# Patient Record
Sex: Male | Born: 1975 | Race: Black or African American | Hispanic: No | Marital: Single | Smoking: Never smoker
Health system: Southern US, Community
[De-identification: ages and names within clinical notes are randomized; demographics above are authoritative.]

## PROBLEM LIST (undated history)

## (undated) DIAGNOSIS — F329 Major depressive disorder, single episode, unspecified: Secondary | ICD-10-CM

## (undated) DIAGNOSIS — F32A Depression, unspecified: Secondary | ICD-10-CM

---

## 1999-10-27 ENCOUNTER — Emergency Department (HOSPITAL_COMMUNITY): Admission: EM | Admit: 1999-10-27 | Discharge: 1999-10-27 | Payer: Self-pay | Admitting: Emergency Medicine

## 2001-12-19 ENCOUNTER — Emergency Department (HOSPITAL_COMMUNITY): Admission: EM | Admit: 2001-12-19 | Discharge: 2001-12-19 | Payer: Self-pay | Admitting: Emergency Medicine

## 2004-07-07 ENCOUNTER — Emergency Department (HOSPITAL_COMMUNITY): Admission: EM | Admit: 2004-07-07 | Discharge: 2004-07-07 | Payer: Self-pay | Admitting: Emergency Medicine

## 2004-07-10 ENCOUNTER — Emergency Department (HOSPITAL_COMMUNITY): Admission: EM | Admit: 2004-07-10 | Discharge: 2004-07-10 | Payer: Self-pay | Admitting: Emergency Medicine

## 2004-09-05 ENCOUNTER — Emergency Department (HOSPITAL_COMMUNITY): Admission: EM | Admit: 2004-09-05 | Discharge: 2004-09-05 | Payer: Self-pay | Admitting: Emergency Medicine

## 2004-11-26 ENCOUNTER — Emergency Department (HOSPITAL_COMMUNITY): Admission: EM | Admit: 2004-11-26 | Discharge: 2004-11-26 | Payer: Self-pay | Admitting: Family Medicine

## 2005-02-08 ENCOUNTER — Emergency Department (HOSPITAL_COMMUNITY): Admission: EM | Admit: 2005-02-08 | Discharge: 2005-02-08 | Payer: Self-pay | Admitting: Emergency Medicine

## 2005-09-03 ENCOUNTER — Ambulatory Visit: Payer: Self-pay | Admitting: Nurse Practitioner

## 2005-09-04 ENCOUNTER — Ambulatory Visit: Payer: Self-pay | Admitting: Nurse Practitioner

## 2005-11-04 ENCOUNTER — Ambulatory Visit: Payer: Self-pay | Admitting: Nurse Practitioner

## 2005-12-08 ENCOUNTER — Ambulatory Visit: Payer: Self-pay | Admitting: Nurse Practitioner

## 2006-02-17 ENCOUNTER — Ambulatory Visit: Payer: Self-pay | Admitting: Nurse Practitioner

## 2006-02-19 ENCOUNTER — Ambulatory Visit: Payer: Self-pay | Admitting: *Deleted

## 2006-03-23 ENCOUNTER — Ambulatory Visit: Payer: Self-pay | Admitting: Nurse Practitioner

## 2006-04-21 ENCOUNTER — Ambulatory Visit: Payer: Self-pay | Admitting: Nurse Practitioner

## 2006-05-26 ENCOUNTER — Ambulatory Visit: Payer: Self-pay | Admitting: Nurse Practitioner

## 2006-08-05 ENCOUNTER — Ambulatory Visit: Payer: Self-pay | Admitting: Internal Medicine

## 2006-09-14 ENCOUNTER — Ambulatory Visit: Payer: Self-pay | Admitting: Family Medicine

## 2006-10-06 ENCOUNTER — Ambulatory Visit: Payer: Self-pay | Admitting: Nurse Practitioner

## 2006-10-07 DIAGNOSIS — M545 Low back pain: Secondary | ICD-10-CM

## 2006-10-07 DIAGNOSIS — F988 Other specified behavioral and emotional disorders with onset usually occurring in childhood and adolescence: Secondary | ICD-10-CM | POA: Insufficient documentation

## 2006-10-20 ENCOUNTER — Encounter (INDEPENDENT_AMBULATORY_CARE_PROVIDER_SITE_OTHER): Payer: Self-pay | Admitting: *Deleted

## 2006-10-24 ENCOUNTER — Emergency Department (HOSPITAL_COMMUNITY): Admission: EM | Admit: 2006-10-24 | Discharge: 2006-10-24 | Payer: Self-pay | Admitting: Emergency Medicine

## 2006-11-01 ENCOUNTER — Ambulatory Visit: Payer: Self-pay | Admitting: Internal Medicine

## 2006-11-02 ENCOUNTER — Emergency Department (HOSPITAL_COMMUNITY): Admission: EM | Admit: 2006-11-02 | Discharge: 2006-11-02 | Payer: Self-pay | Admitting: Emergency Medicine

## 2006-11-08 ENCOUNTER — Ambulatory Visit: Payer: Self-pay | Admitting: Internal Medicine

## 2006-12-20 ENCOUNTER — Ambulatory Visit: Payer: Self-pay | Admitting: Family Medicine

## 2007-02-10 ENCOUNTER — Emergency Department (HOSPITAL_COMMUNITY): Admission: EM | Admit: 2007-02-10 | Discharge: 2007-02-10 | Payer: Self-pay | Admitting: Family Medicine

## 2007-02-16 ENCOUNTER — Ambulatory Visit: Payer: Self-pay | Admitting: Internal Medicine

## 2007-04-05 ENCOUNTER — Ambulatory Visit: Payer: Self-pay | Admitting: Family Medicine

## 2007-07-13 ENCOUNTER — Ambulatory Visit: Payer: Self-pay | Admitting: Family Medicine

## 2007-09-05 ENCOUNTER — Emergency Department (HOSPITAL_COMMUNITY): Admission: EM | Admit: 2007-09-05 | Discharge: 2007-09-05 | Payer: Self-pay | Admitting: Emergency Medicine

## 2007-09-21 ENCOUNTER — Ambulatory Visit: Payer: Self-pay | Admitting: Internal Medicine

## 2007-09-21 LAB — CONVERTED CEMR LAB
Chloride: 104 meq/L (ref 96–112)
Cholesterol: 142 mg/dL (ref 0–200)
Creatinine, Ser: 1.27 mg/dL (ref 0.40–1.50)
HDL: 60 mg/dL (ref >39)
LDL Cholesterol: 58 mg/dL (ref 0–99)
Total CHOL/HDL Ratio: 2.4
Triglycerides: 121 mg/dL (ref <150)
VLDL: 24 mg/dL (ref 0–40)

## 2007-12-27 ENCOUNTER — Ambulatory Visit: Payer: Self-pay | Admitting: Internal Medicine

## 2008-05-31 ENCOUNTER — Ambulatory Visit: Payer: Self-pay | Admitting: Internal Medicine

## 2009-01-31 ENCOUNTER — Emergency Department (HOSPITAL_COMMUNITY): Admission: EM | Admit: 2009-01-31 | Discharge: 2009-01-31 | Payer: Self-pay | Admitting: Family Medicine

## 2009-02-02 ENCOUNTER — Emergency Department (HOSPITAL_COMMUNITY): Admission: EM | Admit: 2009-02-02 | Discharge: 2009-02-02 | Payer: Self-pay | Admitting: Emergency Medicine

## 2009-04-18 ENCOUNTER — Ambulatory Visit: Payer: Self-pay | Admitting: Internal Medicine

## 2009-04-24 ENCOUNTER — Ambulatory Visit: Payer: Self-pay | Admitting: Internal Medicine

## 2009-06-25 ENCOUNTER — Ambulatory Visit: Payer: Self-pay | Admitting: Family Medicine

## 2009-07-23 ENCOUNTER — Ambulatory Visit: Payer: Self-pay | Admitting: Internal Medicine

## 2010-02-23 ENCOUNTER — Encounter: Payer: Self-pay | Admitting: Nurse Practitioner

## 2010-11-13 LAB — URINE MICROSCOPIC-ADD ON

## 2010-11-13 LAB — URINALYSIS, ROUTINE W REFLEX MICROSCOPIC
Leukocytes, UA: NEGATIVE
Nitrite: NEGATIVE
pH: 7

## 2010-11-13 LAB — POCT URINALYSIS DIP (DEVICE)
Nitrite: NEGATIVE
Protein, ur: NEGATIVE
pH: 6.5

## 2014-05-23 ENCOUNTER — Encounter (HOSPITAL_COMMUNITY): Payer: Self-pay | Admitting: Emergency Medicine

## 2014-05-23 ENCOUNTER — Emergency Department (HOSPITAL_COMMUNITY)
Admission: EM | Admit: 2014-05-23 | Discharge: 2014-05-23 | Disposition: A | Discharge status: Home / Self Care | Payer: Self-pay | Attending: Emergency Medicine | Admitting: Emergency Medicine

## 2014-05-23 ENCOUNTER — Emergency Department (HOSPITAL_COMMUNITY): Payer: Self-pay

## 2014-05-23 DIAGNOSIS — S63502A Unspecified sprain of left wrist, initial encounter: Secondary | ICD-10-CM | POA: Insufficient documentation

## 2014-05-23 DIAGNOSIS — Z791 Long term (current) use of non-steroidal anti-inflammatories (NSAID): Secondary | ICD-10-CM | POA: Insufficient documentation

## 2014-05-23 DIAGNOSIS — Y9389 Activity, other specified: Secondary | ICD-10-CM | POA: Insufficient documentation

## 2014-05-23 DIAGNOSIS — Y990 Civilian activity done for income or pay: Secondary | ICD-10-CM | POA: Insufficient documentation

## 2014-05-23 DIAGNOSIS — Y9289 Other specified places as the place of occurrence of the external cause: Secondary | ICD-10-CM | POA: Insufficient documentation

## 2014-05-23 DIAGNOSIS — Z79899 Other long term (current) drug therapy: Secondary | ICD-10-CM | POA: Insufficient documentation

## 2014-05-23 DIAGNOSIS — F329 Major depressive disorder, single episode, unspecified: Secondary | ICD-10-CM | POA: Insufficient documentation

## 2014-05-23 DIAGNOSIS — X58XXXA Exposure to other specified factors, initial encounter: Secondary | ICD-10-CM | POA: Insufficient documentation

## 2014-05-23 HISTORY — DX: Major depressive disorder, single episode, unspecified: F32.9

## 2014-05-23 HISTORY — DX: Depression, unspecified: F32.A

## 2014-05-23 MED ORDER — CYCLOBENZAPRINE HCL 5 MG PO TABS: 5.0000 mg | ORAL_TABLET | Freq: Two times a day (BID) | ORAL | Status: DC | PRN

## 2014-05-23 MED ORDER — NAPROXEN 375 MG PO TABS: 375.0000 mg | ORAL_TABLET | Freq: Two times a day (BID) | ORAL | Status: DC

## 2014-05-23 MED ORDER — TRAMADOL HCL 50 MG PO TABS
50.0000 mg | ORAL_TABLET | Freq: Once | ORAL | Status: AC
  Administered 2014-05-23: 50 mg via ORAL
  Filled 2014-05-23: qty 1

## 2014-05-23 MED ORDER — HYDROCODONE-ACETAMINOPHEN 5-325 MG PO TABS: 1.0000 | ORAL_TABLET | Freq: Four times a day (QID) | ORAL | Status: DC | PRN

## 2014-05-23 NOTE — ED Notes (Signed)
PT ambulated with baseline gait; VSS; A&Ox3; no signs of distress; respirations even and unlabored; skin warm and dry; no questions upon discharge.  

## 2014-05-23 NOTE — ED Notes (Signed)
Patient to xray.

## 2014-05-23 NOTE — ED Notes (Signed)
Patient with pain in left arm since last Friday.  States he was using a shovel and the pain increased during the week.  Patient states that he has swelling in left forearm and hand.

## 2014-05-23 NOTE — ED Provider Notes (Signed)
CSN: [id number]     Arrival date & time 05/23/14  0530 History   First MD Initiated Contact with Patient 05/23/14 [phone]     Chief Complaint  Patient presents with  . Arm Pain     (Consider location/radiation/quality/duration/timing/severity/associated sxs/prior Treatment) HPI   [name] is a 38-y.o. Male, PMH of depression,  presenting with left arm pain d/t work injury using a shovel 5 days ago. A hard impact of shovel to dirt caused a sharp pain in left wrist and tingling in his fingers. Arm is swollen above wrist and hurts to move wrist in any direction; needs to hold wrist palm down.  Pain has worsened, ibuprofen not lessening pain. Cannot sleep or work due to pain. Denies any previous any previous hx of injury to the wrist.  Denies: denies change of color, denies weakness or numbness to the arm, no open wounds    Past Medical History  Diagnosis Date  . Depression    History reviewed. No pertinent past surgical history. No family history on file. History  Substance Use Topics  . Smoking status: Never Smoker   . Smokeless tobacco: Not on file  . Alcohol Use: Yes    Review of Systems  10 Systems reviewed and are negative for acute change except as noted in the HPI.    Allergies  Review of patient's allergies indicates no known allergies.  Home Medications   Prior to Admission medications   Medication Sig Start Date End Date Taking? Authorizing Provider  arginine 500 MG tablet Take 500 mg by mouth daily.   Yes Historical Provider, MD  sertraline (ZOLOFT) 100 MG tablet Take 100 mg by mouth daily.   Yes Historical Provider, MD  cyclobenzaprine (FLEXERIL) 5 MG tablet Take 1 tablet (5 mg total) by mouth 2 (two) times daily as needed for muscle spasms. 05/23/14   Marlon Peliffany Philbert Ocallaghan, PA-C  HYDROcodone-acetaminophen (NORCO/VICODIN) 5-325 MG per tablet Take 1 tablet by mouth every 6 (six) hours as needed. 05/23/14   Amrit Erck Neva SeatGreene, PA-C  naproxen (NAPROSYN) 375 MG tablet Take 1  tablet (375 mg total) by mouth 2 (two) times daily. 05/23/14   Jonah Gingras Neva SeatGreene, PA-C   BP 136/89 mmHg  Pulse 65  Temp(Src) 98.4 F (36.9 C) (Oral)  Resp 20  SpO2 100% Physical Exam  Constitutional: He appears well-developed and well-nourished. No distress.  HENT:  Head: Normocephalic and atraumatic.  Eyes: Pupils are equal, round, and reactive to light.  Neck: Normal range of motion. Neck supple.  Cardiovascular: Normal rate.   Pulmonary/Chest: Effort normal.  Abdominal: Soft.  Musculoskeletal:       Left wrist: He exhibits decreased range of motion (due to pain), tenderness (distal radial aspect), bony tenderness and swelling (distal radial side of forearm). He exhibits no effusion, no crepitus, no deformity and no laceration.  No erythema or ecchymosis. radial pulse intact, cap refill <2secs to left wrist.  Grip strengths equal.  Neurological: He is alert.  Skin: Skin is warm and dry.  Nursing note and vitals reviewed.   ED Course  Procedures (including critical care time) Labs Review Labs Reviewed - No data to display  Imaging Review Dg Wrist Complete Left  05/23/2014   CLINICAL DATA:  Left wrist pain after injury while shoveling yesterday.  EXAM: LEFT WRIST - COMPLETE 3+ VIEW  COMPARISON:  None.  FINDINGS: There is no evidence of fracture or dislocation. There is no evidence of arthropathy or other focal bone abnormality. Soft tissues are unremarkable.  IMPRESSION:  Normal exam.   Electronically Signed   By: Francene Boyers M.D.   On: 05/23/2014 07:01     EKG Interpretation None      MDM   Final diagnoses:  Wrist sprain, left, initial encounter  No fractures seen in x-ray, probable sprain. No signs of neurovascular compromise and weakness/numbness to suggest any nerve related pathology. Wrist splint for comfort and immobilization.   Vicodin (6 tabs) for night time pain, naproxen, flexeril for daytime. Recommended RICE. Orthopedic referral due to swelling and need for  further evaluation.  39 y.o.Ronald Ferrell's evaluation in the Emergency Department is complete. It has been determined that no acute conditions requiring further emergency intervention are present at this time. The patient/guardian have been advised of the diagnosis and plan. We have discussed signs and symptoms that warrant return to the ED, such as changes or worsening in symptoms.  Vital signs are stable at discharge. Filed Vitals:   05/23/14 0738  BP: 134/92  Pulse: 110  Temp:   Resp:     Patient/guardian has voiced understanding and agreed to follow-up with the PCP or specialist.          Marlon Pel, PA-C 05/23/14 1610  Vanetta Mulders, MD 05/25/14 [phone]

## 2014-05-23 NOTE — Discharge Instructions (Signed)
Sprain °A sprain is a tear in one of the strong, fibrous tissues that connect your bones (ligaments). The severity of the sprain depends on how much of the ligament is torn. The tear can be either partial or complete. °CAUSES  °Often, sprains are a result of a fall or an injury. The force of the impact causes the fibers of your ligament to stretch beyond their normal length. This excess tension causes the fibers of your ligament to tear. °SYMPTOMS  °You may have some loss of motion or increased pain within your normal range of motion. Other symptoms include: °· Bruising. °· Tenderness. °· Swelling. °DIAGNOSIS  °In order to diagnose a sprain, your caregiver will physically examine you to determine how torn the ligament is. Your caregiver may also suggest an X-ray exam to make sure no bones are broken. °TREATMENT  °If your ligament is only partially torn, treatment usually involves keeping the injured area in a fixed position (immobilization) for a short period. To do this, your caregiver will apply a bandage, cast, or splint to keep the area from moving until it heals. For a partially torn ligament, the healing process usually takes 2 to 3 weeks. °If your ligament is completely torn, you may need surgery to reconnect the ligament to the bone or to reconstruct the ligament. After surgery, a cast or splint may be applied and will need to stay on for 4 to 6 weeks while your ligament heals. °HOME CARE INSTRUCTIONS °· Keep the injured area elevated to decrease swelling. °· To ease pain and swelling, apply ice to your joint twice a day, for 2 to 3 days. °¨ Put ice in a plastic bag. °¨ Place a towel between your skin and the bag. °¨ Leave the ice on for 15 minutes. °· Only take over-the-counter or prescription medicine for pain as directed by your caregiver. °· Do not leave the injured area unprotected until pain and stiffness go away (usually 3 to 4 weeks). °· Do not allow your cast or splint to get wet. Cover your cast or  splint with a plastic bag when you shower or bathe. Do not swim. °· Your caregiver may suggest exercises for you to do during your recovery to prevent or limit permanent stiffness. °SEEK IMMEDIATE MEDICAL CARE IF: °· Your cast or splint becomes damaged. °· Your pain becomes worse. °MAKE SURE YOU: °· Understand these instructions. °· Will watch your condition. °· Will get help right away if you are not doing well or get worse. °Document Released: 01/17/2000 Document Revised: 04/13/2011 Document Reviewed: 01/31/2011 °[employer]® Patient Information ©2015 [employer]. This information is not intended to replace advice given to you by your health care provider. Make sure you discuss any questions you have with your health care provider. ° °

## 2015-08-02 ENCOUNTER — Ambulatory Visit (HOSPITAL_COMMUNITY)
Admission: EM | Admit: 2015-08-02 | Discharge: 2015-08-02 | Disposition: A | Discharge status: Home / Self Care | Payer: Self-pay | Attending: Emergency Medicine | Admitting: Emergency Medicine

## 2015-08-02 ENCOUNTER — Encounter (HOSPITAL_COMMUNITY): Payer: Self-pay | Admitting: Emergency Medicine

## 2015-08-02 DIAGNOSIS — M7712 Lateral epicondylitis, left elbow: Secondary | ICD-10-CM

## 2015-08-02 DIAGNOSIS — M7582 Other shoulder lesions, left shoulder: Secondary | ICD-10-CM

## 2015-08-02 DIAGNOSIS — M778 Other enthesopathies, not elsewhere classified: Secondary | ICD-10-CM

## 2015-08-02 MED ORDER — MELOXICAM 15 MG PO TABS
15.0000 mg | ORAL_TABLET | Freq: Every day | ORAL | Status: DC
Start: 2015-08-02 — End: 2020-05-01

## 2015-08-02 MED ORDER — HYDROCODONE-ACETAMINOPHEN 5-325 MG PO TABS: 1.0000 | ORAL_TABLET | Freq: Four times a day (QID) | ORAL | Status: AC | PRN

## 2015-08-02 NOTE — Discharge Instructions (Signed)
You have tendinitis in your shoulder, elbow, and wrist. This is coming from all the heavy lifting and weight lifting you do. No weight lifting for the next week. I provided a work note to limit lifting with the left arm for the next week. Take meloxicam daily for the next week, after that as needed for pain. Ice your shoulder and elbow 2-3 times a day for the next week. Do gentle range of motion exercises. Use the hydrocodone at bedtime for severe pain. Do not drive while taking this medicine. If this is not improving in the next 5 days, please follow-up with sports medicine for additional evaluation.

## 2015-08-02 NOTE — ED Notes (Signed)
Here for left shoulder pain... Denies inj/trauma Works lifting heavy objects... Pain increases w/activity A&O x4... NAD

## 2015-08-02 NOTE — ED Provider Notes (Signed)
CSN: [id number]     Arrival date & time 08/02/15  1311 History   First MD Initiated Contact with Patient 08/02/15 1338     Chief Complaint  Patient presents with  . Shoulder Pain   (Consider location/radiation/quality/duration/timing/severity/associated sxs/prior Treatment) HPI A 40 year old man here for evaluation of left shoulder pain. He states this is been going on for about a month now. It has gradually been worsening. He has had difficulty sleeping the last few nights from the pain. He denies any specific injury or trauma, but states he does a lot of lifting both at work and at the gym. He has not tried any medications or ice. He was just hoping it would go away.  Pain is in the posterior shoulder and will radiate down to his elbow, depending on how he moves it. He also reports some pain in the lateral elbow and the wrist as well. No numbness, tingling, or weakness.  Past Medical History  Diagnosis Date  . Depression    History reviewed. No pertinent past surgical history. History reviewed. No pertinent family history. Social History  Substance Use Topics  . Smoking status: Never Smoker   . Smokeless tobacco: None  . Alcohol Use: Yes    Review of Systems As in history of present illness Allergies  Review of patient's allergies indicates no known allergies.  Home Medications   Prior to Admission medications   Medication Sig Start Date End Date Taking? Authorizing Provider  arginine 500 MG tablet Take 500 mg by mouth daily.    Historical Provider, MD  HYDROcodone-acetaminophen (NORCO) 5-325 MG tablet Take 1 tablet by mouth every 6 (six) hours as needed for moderate pain. 08/02/15   Charm RingsErin J Honig, MD  meloxicam (MOBIC) 15 MG tablet Take 1 tablet (15 mg total) by mouth daily. 08/02/15   Charm RingsErin J Honig, MD  sertraline (ZOLOFT) 100 MG tablet Take 100 mg by mouth daily.    Historical Provider, MD   Meds Ordered and Administered this Visit  Medications - No data to display  BP  125/74 mmHg  Pulse 83  Temp(Src) 99.1 F (37.3 C) (Oral)  Resp 12  SpO2 100% No data found.   Physical Exam  Constitutional: He is oriented to person, place, and time. He appears well-developed and well-nourished. No distress.  Cardiovascular: Normal rate.   Pulmonary/Chest: Effort normal.  Musculoskeletal:  Left shoulder: No erythema or edema. No asymmetry when compared to the right. No bony tenderness or point tenderness. Positive empty can and Hawkins. Left elbow: No erythema or swelling. He is point tender over the lateral epicondyles. Left wrist: No erythema or swelling. He does have pain with resisted supination. 2+ radial pulse.  Neurological: He is alert and oriented to person, place, and time.    ED Course  Procedures (including critical care time)  Labs Review Labs Reviewed - No data to display  Imaging Review No results found.   MDM   1. Rotator cuff tendinitis, left   2. Tennis elbow syndrome, left   3. Left wrist tendonitis    He has diffuse tendinitis in the left upper extremity. No sign of neurologic involvement. Treat with relative rest, meloxicam, and ice. Work note provided. Prescription for 10 hydrocodone given to use at bedtime as needed. Follow-up with sports medicine if not improving.    Charm RingsErin J Honig, MD 08/02/15 [phone]

## 2015-11-30 ENCOUNTER — Emergency Department (HOSPITAL_COMMUNITY)
Admission: EM | Admit: 2015-11-30 | Discharge: 2015-11-30 | Disposition: A | Discharge status: Home / Self Care | Payer: Self-pay | Attending: Emergency Medicine | Admitting: Emergency Medicine

## 2015-11-30 ENCOUNTER — Encounter (HOSPITAL_COMMUNITY): Payer: Self-pay | Admitting: Emergency Medicine

## 2015-11-30 DIAGNOSIS — Y929 Unspecified place or not applicable: Secondary | ICD-10-CM | POA: Insufficient documentation

## 2015-11-30 DIAGNOSIS — S76312A Strain of muscle, fascia and tendon of the posterior muscle group at thigh level, left thigh, initial encounter: Secondary | ICD-10-CM

## 2015-11-30 DIAGNOSIS — X501XXA Overexertion from prolonged static or awkward postures, initial encounter: Secondary | ICD-10-CM | POA: Insufficient documentation

## 2015-11-30 DIAGNOSIS — Y990 Civilian activity done for income or pay: Secondary | ICD-10-CM | POA: Insufficient documentation

## 2015-11-30 DIAGNOSIS — Y9389 Activity, other specified: Secondary | ICD-10-CM | POA: Insufficient documentation

## 2015-11-30 MED ORDER — NAPROXEN 500 MG PO TABS: 500.0000 mg | ORAL_TABLET | Freq: Two times a day (BID) | ORAL | 0 refills | Status: DC

## 2015-11-30 NOTE — ED Notes (Signed)
6 in ACE wrap applied to left thigh.

## 2015-11-30 NOTE — ED Triage Notes (Signed)
Pt. Stated, I think I pulled a muscle at work on my left thigh.  Its been sore for 2 weeks, and now Its black and blue.I was lifting something and evidentially it pulled the muscle in my leg.

## 2015-11-30 NOTE — Discharge Instructions (Signed)
You likely have a partial tear in your hamstring muscle. Avoid strenuous exercise. Keep bandage on. Follow up with orthopedics for re-evaluation.

## 2015-11-30 NOTE — ED Provider Notes (Signed)
[facility]-EMERGENCY DEPT Provider Note   By signing my name below, I, Ronald Ferrell, attest that this documentation has been prepared under the direction and in the presence of AvayaSamantha Dowless, PA-C. Electronically Signed: Earmon PhoenixJennifer Ferrell, ED Scribe. 11/30/15. 1:32 PM.    History   Chief Complaint Chief Complaint  Patient presents with  . Leg Pain    The history is provided by the patient and medical records. No language interpreter was used.    HPI Comments:  Ronald Ferrell is a 40 y.o. male who presents to the Emergency Department complaining of left posterior thigh pain that began about two weeks ago while at work. He states he squatted down to lift a box when the pain began. He states there has been a knot in the area since the incident. Pt states he noticed the area was bruised as well. He has not taken anything for pain. Walking, sitting and bending the LLE increases the pain. He denies alleviating factors. He denies numbness, tingling or weakness of the lower extremities, fever, chills, nausea, or vomiting. He denies any trauma or fall.   Past Medical History:  Diagnosis Date  . Depression     Patient Active Problem List   Diagnosis Date Noted  . ADD 10/07/2006  . LOW BACK PAIN, CHRONIC 10/07/2006    History reviewed. No pertinent surgical history.     Home Medications    Prior to Admission medications   Medication Sig Start Date End Date Taking? Authorizing Provider  arginine 500 MG tablet Take 500 mg by mouth daily.    Historical Provider, MD  HYDROcodone-acetaminophen (NORCO) 5-325 MG tablet Take 1 tablet by mouth every 6 (six) hours as needed for moderate pain. 08/02/15   Charm RingsErin J Honig, MD  meloxicam (MOBIC) 15 MG tablet Take 1 tablet (15 mg total) by mouth daily. 08/02/15   Charm RingsErin J Honig, MD  sertraline (ZOLOFT) 100 MG tablet Take 100 mg by mouth daily.    Historical Provider, MD    Family History No family history on file.  Social History Social History    Substance Use Topics  . Smoking status: Never Smoker  . Smokeless tobacco: Never Used  . Alcohol use Yes     Allergies   Review of patient's allergies indicates no known allergies.   Review of Systems Review of Systems A complete 10 system review of systems was obtained and all systems are negative except as noted in the HPI and PMH.    Physical Exam Updated Vital Signs BP 143/81 (BP Location: Left Arm)   Pulse 78   Temp 98.1 F (36.7 C) (Oral)   Resp 20   Ht 5\' 7"  (1.702 m)   Wt 215 lb (97.5 kg)   SpO2 100%   BMI 33.67 kg/m   Physical Exam  Constitutional: He is oriented to person, place, and time. He appears well-developed and well-nourished. No distress.  HENT:  Head: Normocephalic and atraumatic.  Eyes: Conjunctivae are normal. Right eye exhibits no discharge. Left eye exhibits no discharge. No scleral icterus.  Cardiovascular: Normal rate.   Pulmonary/Chest: Effort normal.  Musculoskeletal:  TTP and mild swelling over left hamstring. There is ecchymosis present distal to patient's tenderness along posterior thigh and medial aspect of left knee. No tenderness over ecchymosis. Patient still able to flex and extend his quadriceps.  Neurological: He is alert and oriented to person, place, and time. Coordination normal.  No gait abnormality  Skin: Skin is warm and dry. No rash noted. He  is not diaphoretic. No erythema. No pallor.  Psychiatric: He has a normal mood and affect. His behavior is normal.  Nursing note and vitals reviewed.    ED Treatments / Results  DIAGNOSTIC STUDIES: Oxygen Saturation is 100% on RA, normal by my interpretation.   COORDINATION OF CARE: 12:41 PM- Will speak with Dr. Jacqulyn BathLong about appropriate course of treatment. Pt verbalizes understanding and agrees to plan.  Medications - No data to display  Labs (all labs ordered are listed, but only abnormal results are displayed) Labs Reviewed - No data to display  EKG  EKG  Interpretation None       Radiology No results found.  Procedures Procedures (including critical care time)  Medications Ordered in ED Medications - No data to display   Initial Impression / Assessment and Plan / ED Course  I have reviewed the triage vital signs and the nursing notes.  Pertinent labs & imaging results that were available during my care of the patient were reviewed by me and considered in my medical decision making (see chart for details).  Clinical Course    Otherwise healthy 40 year old male presents to the ED today complaining of ongoing left hamstring pain for the last 2 weeks. Patient experienced pain after squatting down and lifting a heavy box, he heard a pop. He's noticed swelling and tenderness to the hamstring mostly with flexion and ambulation. Is also developed bruising along the backside of his knee. On exam, patient has some mild swelling and tenderness to his hamstring. Concern for partial tear of hamstring muscle. Do not suspect complete rupture as patient is still able to flex, extend and ambulate. Will wrapped tightly with Ace bandage. Recommend anti-inflammatories and follow-up with orthopedics. Return precautions outlined in patient discharge instructions.  Case discussed with Dr. Jacqulyn Bathlong who agrees with treatment plan.  I personally performed the services described in this documentation, which was scribed in my presence. The recorded information has been reviewed and is accurate.  Final Clinical Impressions(s) / ED Diagnoses   Final diagnoses:  None    New Prescriptions New Prescriptions   No medications on file     Dub MikesSamantha Tripp Dowless, PA-C 12/01/15 1100    Maia PlanJoshua G Long, MD 12/01/15 [phone]

## 2015-11-30 NOTE — ED Notes (Signed)
C/o pain and bruising to left inner thigh x 2 weeks ago after lifting a heavy box. States her heard "a rip".

## 2016-04-04 IMAGING — DX DG WRIST COMPLETE 3+V*L*
4 series · 4 of 4 positions shown · non-contrast
Comparison: None.

CLINICAL DATA: Left wrist pain after injury while shoveling
yesterday.

EXAM:
LEFT WRIST - COMPLETE 3+ VIEW

[wrist pa]
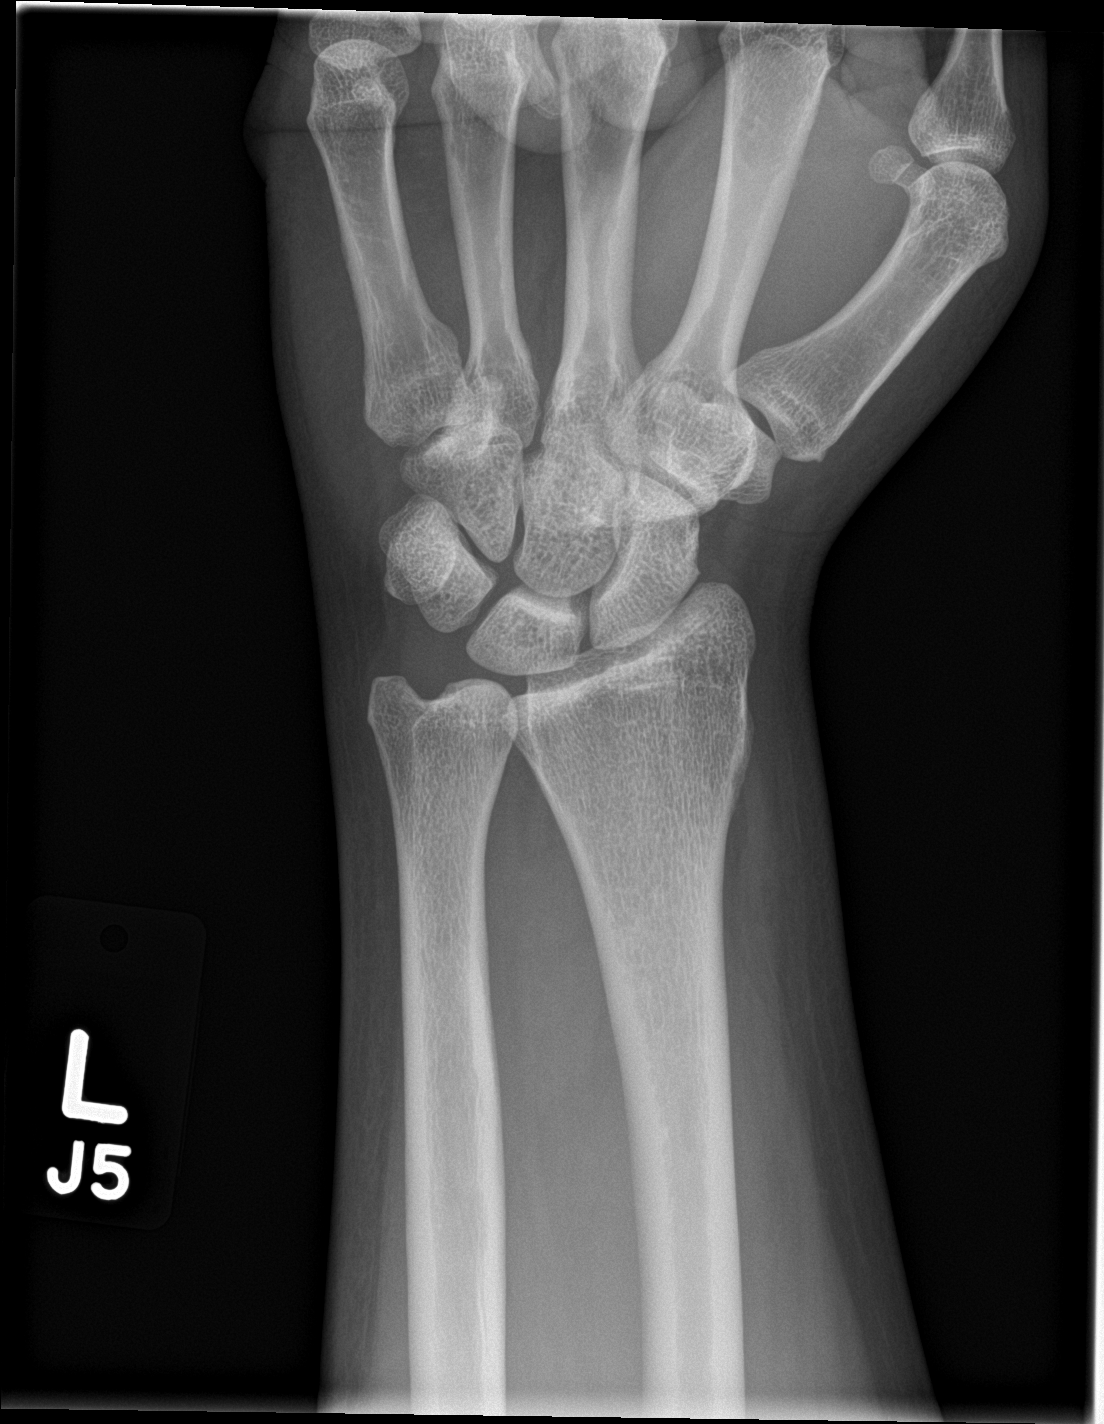

[wrist obl]
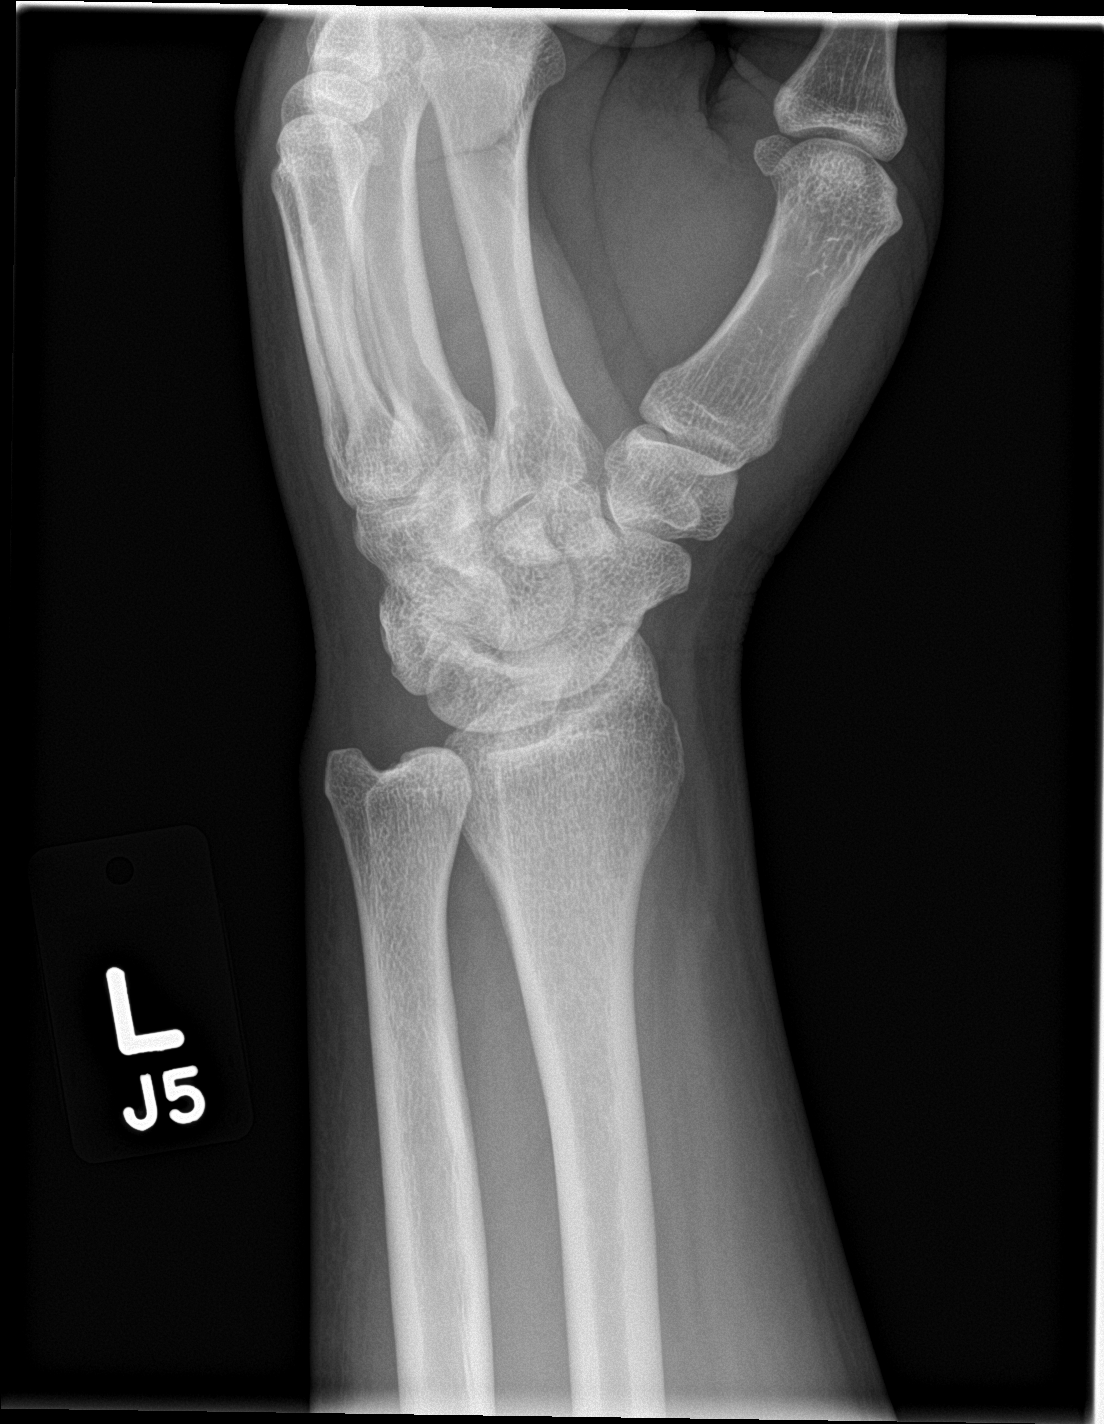

[wrist lat]
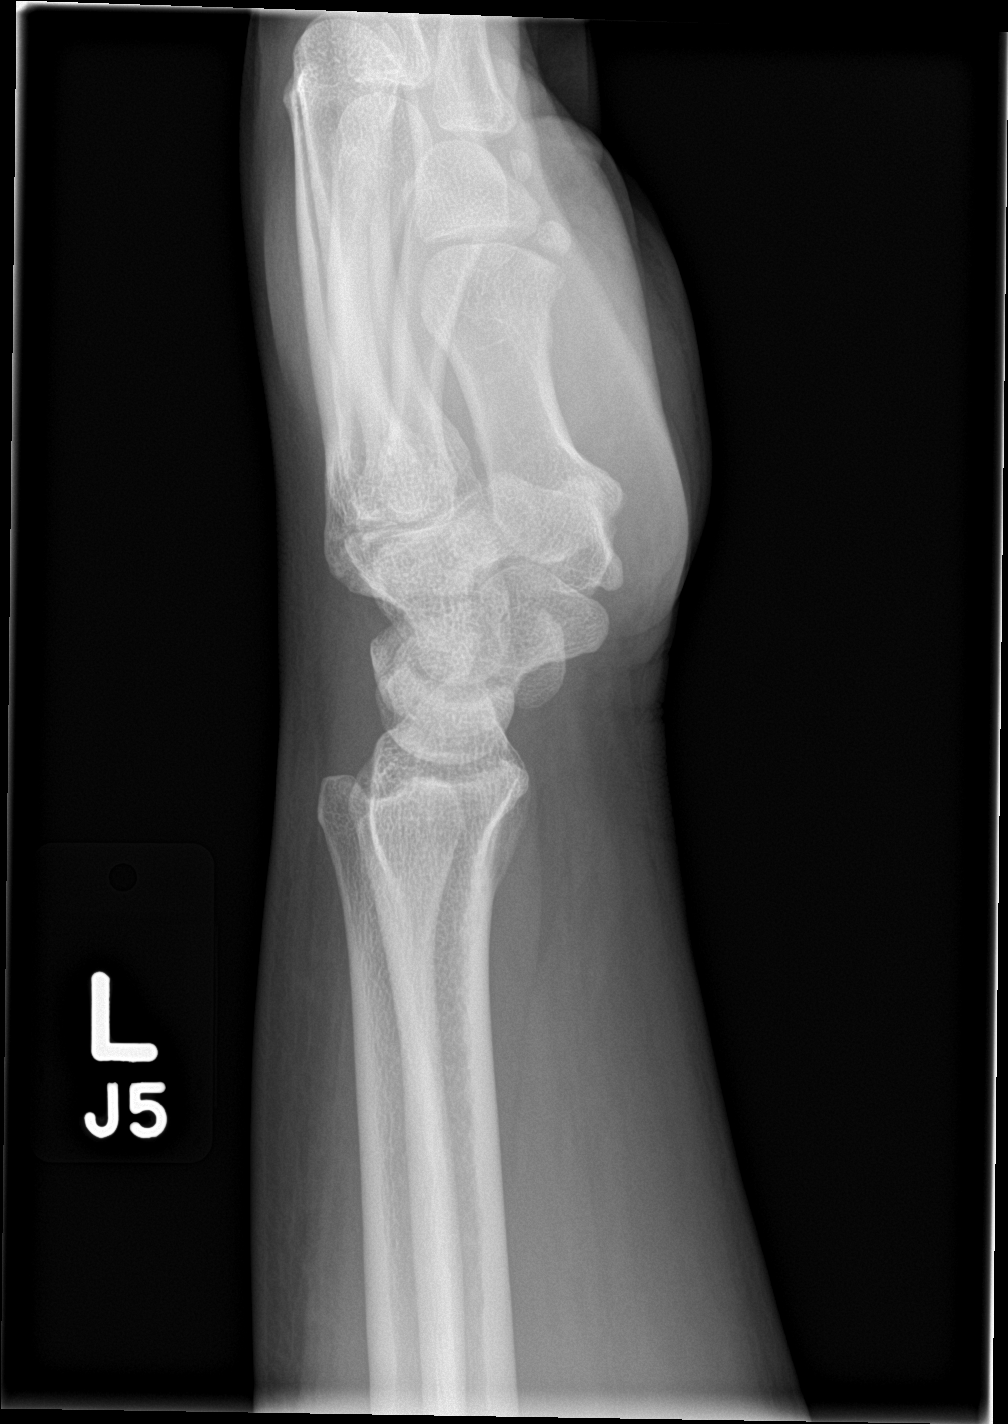

[wrist navicular]
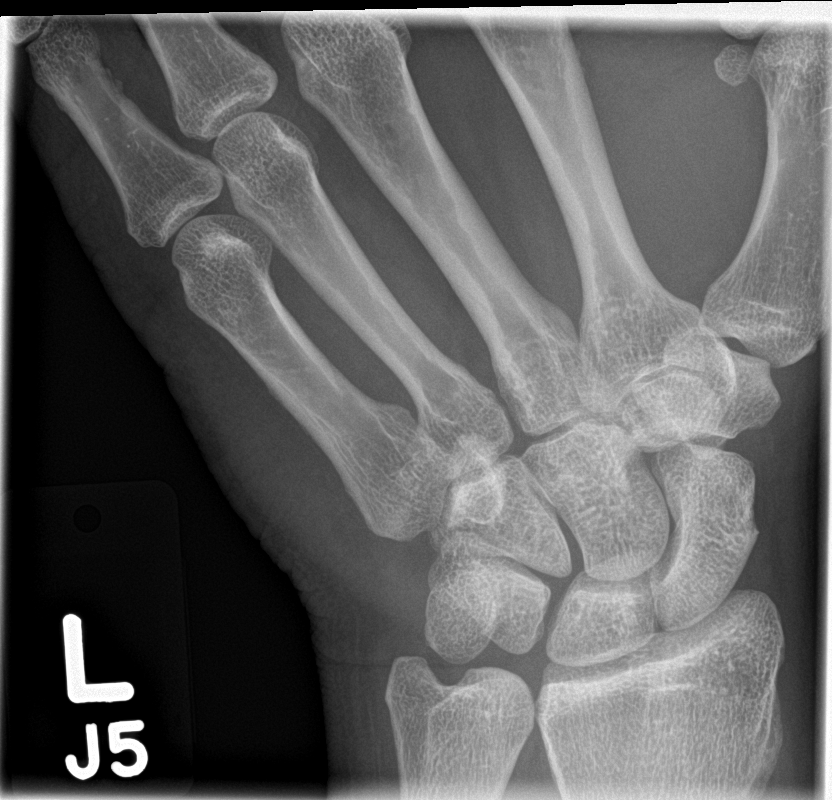

[4 of 4 positions shown; findings below may reference images not displayed]

FINDINGS: There is no evidence of fracture or dislocation. There is no
evidence of arthropathy or other focal bone abnormality. Soft
tissues are unremarkable.
IMPRESSION: Normal exam.

## 2019-09-20 ENCOUNTER — Encounter (HOSPITAL_COMMUNITY): Payer: Self-pay | Admitting: Psychiatry

## 2019-09-20 ENCOUNTER — Other Ambulatory Visit: Payer: Self-pay

## 2019-09-20 ENCOUNTER — Ambulatory Visit (INDEPENDENT_AMBULATORY_CARE_PROVIDER_SITE_OTHER): Payer: No Payment, Other | Admitting: Psychiatry

## 2019-09-20 DIAGNOSIS — F900 Attention-deficit hyperactivity disorder, predominantly inattentive type: Secondary | ICD-10-CM | POA: Diagnosis not present

## 2019-09-20 DIAGNOSIS — F3170 Bipolar disorder, currently in remission, most recent episode unspecified: Secondary | ICD-10-CM

## 2019-09-20 DIAGNOSIS — F319 Bipolar disorder, unspecified: Secondary | ICD-10-CM | POA: Insufficient documentation

## 2019-09-20 DIAGNOSIS — F325 Major depressive disorder, single episode, in full remission: Secondary | ICD-10-CM | POA: Diagnosis not present

## 2019-09-20 MED ORDER — ATOMOXETINE HCL 40 MG PO CAPS: 40.0000 mg | ORAL_CAPSULE | Freq: Every day | ORAL | 2 refills | Status: DC

## 2019-09-20 MED ORDER — SERTRALINE HCL 100 MG PO TABS: 100.0000 mg | ORAL_TABLET | Freq: Every day | ORAL | 2 refills | Status: DC

## 2019-09-20 NOTE — Progress Notes (Signed)
Psychiatric Initial Adult Assessment   Patient Identification: Ronald Ferrell MRN:  [id number] Date of Evaluation:  09/20/2019 Referral Source: [facility]/walkin Chief Complaint:   "I just need refills" Visit Diagnosis:    ICD-10-CM   1. Attention deficit hyperactivity disorder (ADHD), predominantly inattentive type  F90.0 atomoxetine (STRATTERA) 40 MG capsule  2. Depression, major, in remission (HCC)  F32.5 sertraline (ZOLOFT) 100 MG tablet  3. Bipolar disorder in full remission, most recent episode unspecified type (HCC)  F31.70     History of Present Illness: 44 year old male seen today for initial psychiatric evaluation.  He a former patient of [facility].  Today he walked in to the outpatient clinicin for psychiatric evaluation and medication management.  He has a psychiatric history of bipolar disorder and attention deficit disorder.  He is currently being managed on Strattera 40 mg daily and Zoloft 100 mg daily and note these medications are effective in managing his psychiatric conditions.  He denies symptoms of depression, mania, anxiety, SI/HI/VH or paranoia.  He notes that overall he is doing well.  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  He currently lives with his parents and notes that he is not in a relationship because he has trust issues.  He notes in the past several of his partners had abortions and now he enjoys being bachelor.  Patient notes that he finds enjoyment and traveling, going to the gym, and going to different festivals around town.  No medication adjustments made today.  Prescription sent to preferred pharmacy.  Patient will follow up with outpatient provider in 3 months for further assessment.  No other concerns noted at this time.  Associated Signs/Symptoms: Depression Symptoms:  Denies (Hypo) Manic Symptoms:  Denies Anxiety Symptoms:  Denies Psychotic Symptoms:  Denies PTSD  Symptoms: NA  Past Psychiatric History: Polar disorder and ADD Previous Psychotropic Medications: Yes   Substance Abuse History in the last 12 months:  Yes.    Consequences of Substance Abuse: NA  Past Medical History:  Past Medical History:  Diagnosis Date  . Depression    No past surgical history on file.  Family Psychiatric History: Denies  Family History: No family history on file.  Social History:   Social History   Socioeconomic History  . Marital status: Single    Spouse name: Not on file  . Number of children: Not on file  . Years of education: Not on file  . Highest education level: Not on file  Occupational History  . Not on file  Tobacco Use  . Smoking status: Never Smoker  . Smokeless tobacco: Never Used  Substance and Sexual Activity  . Alcohol use: Yes  . Drug use: Yes    Types: Marijuana  . Sexual activity: Not on file  Other Topics Concern  . Not on file  Social History Narrative  . Not on file   Social Determinants of Health   Financial Resource Strain:   . Difficulty of Paying Living Expenses:   Food Insecurity:   . Worried About Programme researcher, broadcasting/film/video in the Last Year:   . Barista in the Last Year:   Transportation Needs:   . Freight forwarder (Medical):   Marland Kitchen Lack of Transportation (Non-Medical):   Physical Activity:   . Days of Exercise per Week:   . Minutes of Exercise per Session:   Stress:   . Feeling of Stress :  Social Connections:   . Frequency of Communication with Friends and Family:   . Frequency of Social Gatherings with Friends and Family:   . Attends Religious Services:   . Active Member of Clubs or Organizations:   . Attends Banker Meetings:   Marland Kitchen Marital Status:     Additional Social History: Patient lives in [place] with his parents.  He is single.  He has no children.  He denies alcohol and tobacco use.  He endorses daily marijuana use.  Allergies:  No Known Allergies  Metabolic  Disorder Labs: No results found for: HGBA1C, MPG No results found for: PROLACTIN Lab Results  Component Value Date   CHOL 142 09/21/2007   TRIG 121 09/21/2007   HDL 60 09/21/2007   CHOLHDL 2.4 Ratio 09/21/2007   VLDL 24 09/21/2007   LDLCALC 58 09/21/2007   No results found for: TSH  Therapeutic Level Labs: No results found for: LITHIUM No results found for: CBMZ No results found for: VALPROATE  Current Medications: Current Outpatient Medications  Medication Sig Dispense Refill  . arginine 500 MG tablet Take 500 mg by mouth daily.    Marland Kitchen atomoxetine (STRATTERA) 40 MG capsule Take 1 capsule (40 mg total) by mouth daily. 30 capsule 2  . HYDROcodone-acetaminophen (NORCO) 5-325 MG tablet Take 1 tablet by mouth every 6 (six) hours as needed for moderate pain. 10 tablet 0  . meloxicam (MOBIC) 15 MG tablet Take 1 tablet (15 mg total) by mouth daily. 30 tablet 0  . naproxen (NAPROSYN) 500 MG tablet Take 1 tablet (500 mg total) by mouth 2 (two) times daily. 30 tablet 0  . sertraline (ZOLOFT) 100 MG tablet Take 1 tablet (100 mg total) by mouth daily. 30 tablet 2   No current facility-administered medications for this visit.    Musculoskeletal: Strength & Muscle Tone: within normal limits Gait & Station: normal Patient leans: N/A  Psychiatric Specialty Exam: Review of Systems  There were no vitals taken for this visit.There is no height or weight on file to calculate BMI.  General Appearance: Well Groomed  Eye Contact:  Good  Speech:  Clear and Coherent and Normal Rate  Volume:  Normal  Mood:  Euthymic  Affect:  Congruent  Thought Process:  Coherent, Goal Directed and Linear  Orientation:  Full (Time, Place, and Person)  Thought Content:  WDL and Logical  Suicidal Thoughts:  No  Homicidal Thoughts:  No  Memory:  Immediate;   Good Recent;   Good Remote;   Good  Judgement:  Good  Insight:  Good  Psychomotor Activity:  Normal  Concentration:  Concentration: Good and Attention  Span: Good  Recall:  Good  Fund of Knowledge:Good  Language: Good  Akathisia:  No  Handed:  Right  AIMS (if indicated):  Not done  Assets:  Communication Skills Desire for Improvement Financial Resources/Insurance Housing Leisure Time Social Support  ADL's:  Intact  Cognition: WNL  Sleep:  Good   Screenings:   Assessment and Plan: Patient reports that he is doing well on his current medication.  No medication changes made today.  He is agreeable to continuing all medications as prescribed.  1. Attention deficit hyperactivity disorder (ADHD), predominantly inattentive type  Continue- atomoxetine (STRATTERA) 40 MG capsule; Take 1 capsule (40 mg total) by mouth daily.  Dispense: 30 capsule; Refill: 2  2. Depression, major, in remission (HCC)  Continue- sertraline (ZOLOFT) 100 MG tablet; Take 1 tablet (100 mg total) by mouth daily.  Dispense:  30 tablet; Refill: 2  3. Bipolar disorder in full remission, most recent episode unspecified type [facility])      Follow-up in 3 months  Shanna Cisco, NP 8/18/20211:36 PM

## 2019-12-13 ENCOUNTER — Other Ambulatory Visit: Payer: Self-pay

## 2019-12-13 ENCOUNTER — Ambulatory Visit (INDEPENDENT_AMBULATORY_CARE_PROVIDER_SITE_OTHER): Payer: No Payment, Other | Admitting: Psychiatry

## 2019-12-13 ENCOUNTER — Encounter (HOSPITAL_COMMUNITY): Payer: Self-pay | Admitting: Psychiatry

## 2019-12-13 DIAGNOSIS — F325 Major depressive disorder, single episode, in full remission: Secondary | ICD-10-CM

## 2019-12-13 DIAGNOSIS — F900 Attention-deficit hyperactivity disorder, predominantly inattentive type: Secondary | ICD-10-CM

## 2019-12-13 MED ORDER — ATOMOXETINE HCL 40 MG PO CAPS: 40.0000 mg | ORAL_CAPSULE | Freq: Every day | ORAL | 2 refills | Status: DC

## 2019-12-13 MED ORDER — SERTRALINE HCL 100 MG PO TABS: 100.0000 mg | ORAL_TABLET | Freq: Every day | ORAL | 2 refills | Status: DC

## 2019-12-13 NOTE — Progress Notes (Signed)
BH MD/PA/NP OP Progress Note  12/13/2019 4:53 PM Ronald Ferrell  MRN:  [id number]  Chief Complaint: "IM just here cause I have to be but im reat" Chief Complaint    Medication Management     HPI: 44 year old male  seen today for follow-up psychiatric evaluation for medication management. Patient has a psychiatric history of ADHD, depression, and bipolar 1 disorder.  He is currently managed on Strattera 40 mg daily and Zoloft 100 mg daily in the morning. Patient reports that his current medication regimen has been effective in managing his psychiatric conditions.   Today, patient reports that things are going perfect.  He is pleasant, cooperative, and well-groomed on assessment.  He denies any anxiety or depressive symptoms today.  He denies feeling on edge, having trouble relaxing, paranoia, or excessive worrying.  He also denies anhedonia, hopelessness, low energy, poor appetite, hypersomnia/insomnia, or poor concentration.  He reports that he is sleeping well and takes naps often after working out at the gym.  He denies any SI/HI/VAH.   Patient endorses that he feels very focused and he hasn't experienced feeling distracted lately.  He also mentioned that he is feeling motivated and he is planning to go back to college soon.   No additional concerns were noted at this time.  Patient is agreeable to continue his current medication regimen as prescribed.   Visit Diagnosis:    ICD-10-CM   1. Attention deficit hyperactivity disorder (ADHD), predominantly inattentive type  F90.0 atomoxetine (STRATTERA) 40 MG capsule  2. Depression, major, in remission (HCC)  F32.5 sertraline (ZOLOFT) 100 MG tablet    Past Psychiatric History: Bipolar disorder and ADD Past Medical History:  Past Medical History:  Diagnosis Date  . Depression    History reviewed. No pertinent surgical history.  Family Psychiatric History: Denies  Family History: History reviewed. No pertinent family history.  Social  History:  Social History   Socioeconomic History  . Marital status: Single    Spouse name: Not on file  . Number of children: Not on file  . Years of education: Not on file  . Highest education level: Not on file  Occupational History  . Not on file  Tobacco Use  . Smoking status: Never Smoker  . Smokeless tobacco: Never Used  Substance and Sexual Activity  . Alcohol use: Yes  . Drug use: Yes    Types: Marijuana  . Sexual activity: Not on file  Other Topics Concern  . Not on file  Social History Narrative  . Not on file   Social Determinants of Health   Financial Resource Strain:   . Difficulty of Paying Living Expenses: Not on file  Food Insecurity:   . Worried About Programme researcher, broadcasting/film/video in the Last Year: Not on file  . Ran Out of Food in the Last Year: Not on file  Transportation Needs:   . Lack of Transportation (Medical): Not on file  . Lack of Transportation (Non-Medical): Not on file  Physical Activity:   . Days of Exercise per Week: Not on file  . Minutes of Exercise per Session: Not on file  Stress:   . Feeling of Stress : Not on file  Social Connections:   . Frequency of Communication with Friends and Family: Not on file  . Frequency of Social Gatherings with Friends and Family: Not on file  . Attends Religious Services: Not on file  . Active Member of Clubs or Organizations: Not on file  . Attends Club or  Organization Meetings: Not on file  . Marital Status: Not on file    Allergies: No Known Allergies  Metabolic Disorder Labs: No results found for: HGBA1C, MPG No results found for: PROLACTIN Lab Results  Component Value Date   CHOL 142 09/21/2007   TRIG 121 09/21/2007   HDL 60 09/21/2007   CHOLHDL 2.4 Ratio 09/21/2007   VLDL 24 09/21/2007   LDLCALC 58 09/21/2007   No results found for: TSH  Therapeutic Level Labs: No results found for: LITHIUM No results found for: VALPROATE No components found for:  CBMZ  Current Medications: Current  Outpatient Medications  Medication Sig Dispense Refill  . arginine 500 MG tablet Take 500 mg by mouth daily.    Marland Kitchen atomoxetine (STRATTERA) 40 MG capsule Take 1 capsule (40 mg total) by mouth daily. 30 capsule 2  . HYDROcodone-acetaminophen (NORCO) 5-325 MG tablet Take 1 tablet by mouth every 6 (six) hours as needed for moderate pain. 10 tablet 0  . meloxicam (MOBIC) 15 MG tablet Take 1 tablet (15 mg total) by mouth daily. 30 tablet 0  . naproxen (NAPROSYN) 500 MG tablet Take 1 tablet (500 mg total) by mouth 2 (two) times daily. 30 tablet 0  . sertraline (ZOLOFT) 100 MG tablet Take 1 tablet (100 mg total) by mouth daily. 30 tablet 2   No current facility-administered medications for this visit.     Musculoskeletal: Strength & Muscle Tone: within normal limits Gait & Station: normal Patient leans: N/A  Psychiatric Specialty Exam: Review of Systems  Blood pressure (!) 160/99, pulse 66, temperature 98.1 F (36.7 C), temperature source Oral, height 5\' 8"  (1.727 m), weight 210 lb (95.3 kg), SpO2 100 %.Body mass index is 31.93 kg/m.  General Appearance: Well Groomed  Eye Contact:  Good  Speech:  Clear and Coherent and Normal Rate  Volume:  Normal  Mood:  Euthymic  Affect:  Congruent  Thought Process:  Coherent, Goal Directed and Linear  Orientation:  Full (Time, Place, and Person)  Thought Content: WDL and Logical   Suicidal Thoughts:  No  Homicidal Thoughts:  No  Memory:  Immediate;   Good Recent;   Good Remote;   Good  Judgement:  Good  Insight:  Good  Psychomotor Activity:  Normal  Concentration:  Concentration: Good and Attention Span: Good  Recall:  Good  Fund of Knowledge: Good  Language: Good  Akathisia:  No  Handed:  Right  AIMS (if indicated): Not done  Assets:  Communication Skills Desire for Improvement Financial Resources/Insurance Housing Social Support  ADL's:  Intact  Cognition: WNL  Sleep:  Good   Screenings: GAD-7     Clinical Support [facility] 12/13/2019  in [facility]  Total GAD-7 Score 0    PHQ2-9     Clinical Support [facility] 12/13/2019 in [facility]  PHQ-2 Total Score 0  PHQ-9 Total Score 1       Assessment and Plan: Patient reports that he is doing well on his current medication.  No medication changes made today.  He is agreeable to continuing all medications as prescribed.  1. Attention deficit hyperactivity disorder (ADHD), predominantly inattentive type  Continue- atomoxetine (STRATTERA) 40 MG capsule; Take 1 capsule (40 mg total) by mouth daily.  Dispense: 30 capsule; Refill: 2  2. Depression, major, in remission (HCC)  Continue- sertraline (ZOLOFT) 100 MG tablet; Take 1 tablet (100 mg total) by mouth daily.  Dispense: 30 tablet; Refill: 2   Isaih Bulger E  Doyne Keel, NP 12/13/2019, 4:53 PM

## 2020-03-14 ENCOUNTER — Encounter (HOSPITAL_COMMUNITY): Payer: No Payment, Other | Admitting: Psychiatry

## 2020-05-01 ENCOUNTER — Ambulatory Visit (INDEPENDENT_AMBULATORY_CARE_PROVIDER_SITE_OTHER): Payer: No Payment, Other | Admitting: Psychiatry

## 2020-05-01 ENCOUNTER — Other Ambulatory Visit: Payer: Self-pay

## 2020-05-01 ENCOUNTER — Encounter (HOSPITAL_COMMUNITY): Payer: Self-pay | Admitting: Psychiatry

## 2020-05-01 DIAGNOSIS — F325 Major depressive disorder, single episode, in full remission: Secondary | ICD-10-CM

## 2020-05-01 DIAGNOSIS — F900 Attention-deficit hyperactivity disorder, predominantly inattentive type: Secondary | ICD-10-CM

## 2020-05-01 MED ORDER — SERTRALINE HCL 100 MG PO TABS: 100.0000 mg | ORAL_TABLET | Freq: Every day | ORAL | 2 refills | Status: DC

## 2020-05-01 MED ORDER — ATOMOXETINE HCL 40 MG PO CAPS: 40.0000 mg | ORAL_CAPSULE | Freq: Every day | ORAL | 2 refills | Status: DC

## 2020-05-01 NOTE — Progress Notes (Signed)
BH MD/PA/NP OP Progress Note  05/01/2020 8:40 AM Ronald Ferrell  MRN:  [id number]  Chief Complaint: "Everything is going good"  HPI: 45 year old male  seen today for follow-up psychiatric evaluation. He has a psychiatric history of ADHD, depression, and bipolar 1 disorder.  He is currently managed on Strattera 40 mg daily and Zoloft 100 mg daily in the morning. Patient reports that his current medication regimen is  effective in managing his psychiatric conditions.   Today is pleasant, cooperative, well-groomed engaged in conversation, and maintained eye contact.  He informed provider that since his last visit he was arrested due to possession of drugs.  He notes that the drugs were not his but a friends who placed them on him when he was sleeping.  He reports that he feels that being in jail was a blessing as he is no longer on probation and did not contract Covid when his mother had it during the same time he was in jail.  Today he notes that he has no depression or anxiety.  Provider conducted a GAD-7 and a PHQ-9 and patient scored a 0 on both.  He endorses adequate sleep and appetite.  Today he denies any SI/HI/VAH, mania, or paranoia.   Patient notes that today he plans to go to the gym and then return to work at [employer].  No medication changes made today.  Patient agreeable to continue medication as prescribed.  No other concerns noted at this time.   Visit Diagnosis:    ICD-10-CM   1. Depression, major, in remission (HCC)  F32.5 sertraline (ZOLOFT) 100 MG tablet  2. Attention deficit hyperactivity disorder (ADHD), predominantly inattentive type  F90.0 atomoxetine (STRATTERA) 40 MG capsule    Past Psychiatric History: Bipolar disorder and ADD Past Medical History:  Past Medical History:  Diagnosis Date  . Depression    History reviewed. No pertinent surgical history.  Family Psychiatric History: Denies  Family History: History reviewed. No pertinent family history.  Social  History:  Social History   Socioeconomic History  . Marital status: Single    Spouse name: Not on file  . Number of children: Not on file  . Years of education: Not on file  . Highest education level: Not on file  Occupational History  . Not on file  Tobacco Use  . Smoking status: Never Smoker  . Smokeless tobacco: Never Used  Substance and Sexual Activity  . Alcohol use: Yes  . Drug use: Yes    Types: Marijuana  . Sexual activity: Not on file  Other Topics Concern  . Not on file  Social History Narrative  . Not on file   Social Determinants of Health   Financial Resource Strain: Not on file  Food Insecurity: Not on file  Transportation Needs: Not on file  Physical Activity: Not on file  Stress: Not on file  Social Connections: Not on file    Allergies: No Known Allergies  Metabolic Disorder Labs: No results found for: HGBA1C, MPG No results found for: PROLACTIN Lab Results  Component Value Date   CHOL 142 09/21/2007   TRIG 121 09/21/2007   HDL 60 09/21/2007   CHOLHDL 2.4 Ratio 09/21/2007   VLDL 24 09/21/2007   LDLCALC 58 09/21/2007   No results found for: TSH  Therapeutic Level Labs: No results found for: LITHIUM No results found for: VALPROATE No components found for:  CBMZ  Current Medications: Current Outpatient Medications  Medication Sig Dispense Refill  . arginine 500 MG  tablet Take 500 mg by mouth daily.    Marland Kitchen atomoxetine (STRATTERA) 40 MG capsule Take 1 capsule (40 mg total) by mouth daily. 30 capsule 2  . HYDROcodone-acetaminophen (NORCO) 5-325 MG tablet Take 1 tablet by mouth every 6 (six) hours as needed for moderate pain. 10 tablet 0  . sertraline (ZOLOFT) 100 MG tablet Take 1 tablet (100 mg total) by mouth daily. 30 tablet 2   No current facility-administered medications for this visit.     Musculoskeletal: Strength & Muscle Tone: within normal limits Gait & Station: normal Patient leans: N/A  Psychiatric Specialty Exam: Review of  Systems  There were no vitals taken for this visit.There is no height or weight on file to calculate BMI.  General Appearance: Well Groomed  Eye Contact:  Good  Speech:  Clear and Coherent and Normal Rate  Volume:  Normal  Mood:  Euthymic  Affect:  Congruent  Thought Process:  Coherent, Goal Directed and Linear  Orientation:  Full (Time, Place, and Person)  Thought Content: WDL and Logical   Suicidal Thoughts:  No  Homicidal Thoughts:  No  Memory:  Immediate;   Good Recent;   Good Remote;   Good  Judgement:  Good  Insight:  Good  Psychomotor Activity:  Normal  Concentration:  Concentration: Good and Attention Span: Good  Recall:  Good  Fund of Knowledge: Good  Language: Good  Akathisia:  No  Handed:  Right  AIMS (if indicated): Not done  Assets:  Communication Skills Desire for Improvement Financial Resources/Insurance Housing Social Support  ADL's:  Intact  Cognition: WNL  Sleep:  Good   Screenings: GAD-7   Flowsheet Row Clinical Support from 05/01/2020 in [facility] Clinical Support from 12/13/2019 in [facility]  Total GAD-7 Score 0 0    PHQ2-9   Flowsheet Row Clinical Support from 05/01/2020 in [facility] Clinical Support from 12/13/2019 in [facility]  PHQ-2 Total Score 0 0  PHQ-9 Total Score 0 1    Flowsheet Row Clinical Support from 05/01/2020 in [facility]  C-SSRS RISK CATEGORY No Risk       Assessment and Plan: Patient reports that he is doing well on his current medication.  No medication changes made today.  He is agreeable to continuing all medications as prescribed.  1. Attention deficit hyperactivity disorder (ADHD), predominantly inattentive type  Continue- atomoxetine (STRATTERA) 40 MG capsule; Take 1 capsule (40 mg total) by mouth daily.  Dispense: 30 capsule; Refill: 2  2. Depression, major, in  remission (HCC)  Continue- sertraline (ZOLOFT) 100 MG tablet; Take 1 tablet (100 mg total) by mouth daily.  Dispense: 30 tablet; Refill: 2   Shanna Cisco, NP 05/01/2020, 8:40 AM

## 2020-07-25 ENCOUNTER — Encounter (HOSPITAL_COMMUNITY): Payer: No Payment, Other | Admitting: Psychiatry

## 2020-09-05 ENCOUNTER — Telehealth (HOSPITAL_COMMUNITY): Payer: Self-pay | Admitting: Psychiatry

## 2020-09-05 ENCOUNTER — Other Ambulatory Visit (HOSPITAL_COMMUNITY): Payer: Self-pay | Admitting: Psychiatry

## 2020-09-05 DIAGNOSIS — F900 Attention-deficit hyperactivity disorder, predominantly inattentive type: Secondary | ICD-10-CM

## 2020-09-05 DIAGNOSIS — F325 Major depressive disorder, single episode, in full remission: Secondary | ICD-10-CM

## 2020-09-05 MED ORDER — ATOMOXETINE HCL 40 MG PO CAPS: 40.0000 mg | ORAL_CAPSULE | Freq: Every day | ORAL | 2 refills | Status: DC

## 2020-09-05 MED ORDER — SERTRALINE HCL 100 MG PO TABS: 100.0000 mg | ORAL_TABLET | Freq: Every day | ORAL | 2 refills | Status: DC

## 2020-09-05 NOTE — Telephone Encounter (Signed)
Medications refilled and sent to preferred pharmacy.

## 2020-09-05 NOTE — Telephone Encounter (Signed)
Patient called for REFILL:  ZOLOFT 100 MG & STRATTERA 40 MG  Pharmacy :  [facility]- [address] [place]  Patient phone number: [phone] Last seen:  04/2020.  No Show 07/2020 COMMENTS:   Next appt 10/29/20

## 2020-10-29 ENCOUNTER — Encounter (HOSPITAL_COMMUNITY): Payer: No Payment, Other | Admitting: Psychiatry

## 2020-12-12 ENCOUNTER — Other Ambulatory Visit (HOSPITAL_COMMUNITY): Payer: Self-pay | Admitting: Psychiatry

## 2020-12-12 DIAGNOSIS — F900 Attention-deficit hyperactivity disorder, predominantly inattentive type: Secondary | ICD-10-CM

## 2020-12-16 ENCOUNTER — Other Ambulatory Visit (HOSPITAL_COMMUNITY): Payer: Self-pay | Admitting: Psychiatry

## 2020-12-16 DIAGNOSIS — F325 Major depressive disorder, single episode, in full remission: Secondary | ICD-10-CM

## 2020-12-16 NOTE — Telephone Encounter (Signed)
Provider will fill patient meds 1 more time however he will need an appointment for further refills.

## 2020-12-23 ENCOUNTER — Encounter (HOSPITAL_COMMUNITY): Payer: No Payment, Other | Admitting: Psychiatry

## 2021-02-25 ENCOUNTER — Encounter (HOSPITAL_COMMUNITY): Payer: Self-pay | Admitting: Psychiatry

## 2021-02-25 ENCOUNTER — Telehealth (INDEPENDENT_AMBULATORY_CARE_PROVIDER_SITE_OTHER): Payer: No Payment, Other | Admitting: Psychiatry

## 2021-02-25 DIAGNOSIS — F325 Major depressive disorder, single episode, in full remission: Secondary | ICD-10-CM | POA: Diagnosis not present

## 2021-02-25 DIAGNOSIS — F900 Attention-deficit hyperactivity disorder, predominantly inattentive type: Secondary | ICD-10-CM | POA: Diagnosis not present

## 2021-02-25 MED ORDER — SERTRALINE HCL 100 MG PO TABS: 100.0000 mg | ORAL_TABLET | Freq: Every day | ORAL | 3 refills | Status: DC

## 2021-02-25 MED ORDER — ATOMOXETINE HCL 40 MG PO CAPS: 40.0000 mg | ORAL_CAPSULE | Freq: Every day | ORAL | 3 refills | Status: DC

## 2021-02-25 NOTE — Progress Notes (Signed)
BH MD/PA/NP OP Progress Note Virtual Visit via Telephone Note  I connected with Ronald Ferrell on 02/25/21 at 11:00 AM EST by telephone and verified that I am speaking with the correct person using two identifiers.  Location: Patient: home Provider: Clinic   I discussed the limitations, risks, security and privacy concerns of performing an evaluation and management service by telephone and the availability of in person appointments. I also discussed with the patient that there may be a patient responsible charge related to this service. The patient expressed understanding and agreed to proceed.   I provided 30 minutes of non-face-to-face time during this encounter.  02/25/2021 11:06 AM Ronald Ferrell  MRN:  [id number]  Chief Complaint: "I feel great. Nothing has changed"  HPI: 46 year old male  seen today for follow-up psychiatric evaluation. He has a psychiatric history of ADHD, depression, and bipolar 1 disorder.  He is currently managed on Strattera 40 mg daily and Zoloft 100 mg daily in the morning. Patient reports that his current medication regimen is  effective in managing his psychiatric conditions.   Today was unable to login virtually so assessment was done over the phone.  During exam he was pleasant, cooperative, and engaged in conversation. He informed provider that he feels great and notes that not much has changed.  Patient informed writer that his mood is stable and reports that he has minimal anxiety and depression.  Provider conducted a GAD-7 and patient scored a 3, at his last visit he scored a 0.  Provider also conducted PHQ-9 and patient scored a 0, at his last visit he scored a 0. He endorses adequate sleep and appetite.  Today he denies any SI/HI/VAH, mania, or paranoia.   No medication changes made today.  Patient agreeable to continue medication as prescribed.  No other concerns noted at this time.   Visit Diagnosis:    ICD-10-CM   1. Attention deficit hyperactivity  disorder (ADHD), predominantly inattentive type  F90.0 atomoxetine (STRATTERA) 40 MG capsule    2. Depression, major, in remission (HCC)  F32.5 sertraline (ZOLOFT) 100 MG tablet      Past Psychiatric History: Bipolar disorder and ADD Past Medical History:  Past Medical History:  Diagnosis Date   Depression    History reviewed. No pertinent surgical history.  Family Psychiatric History: Denies  Family History: History reviewed. No pertinent family history.  Social History:  Social History   Socioeconomic History   Marital status: Single    Spouse name: Not on file   Number of children: Not on file   Years of education: Not on file   Highest education level: Not on file  Occupational History   Not on file  Tobacco Use   Smoking status: Never   Smokeless tobacco: Never  Substance and Sexual Activity   Alcohol use: Yes   Drug use: Yes    Types: Marijuana   Sexual activity: Not on file  Other Topics Concern   Not on file  Social History Narrative   Not on file   Social Determinants of Health   Financial Resource Strain: Not on file  Food Insecurity: Not on file  Transportation Needs: Not on file  Physical Activity: Not on file  Stress: Not on file  Social Connections: Not on file    Allergies: No Known Allergies  Metabolic Disorder Labs: No results found for: HGBA1C, MPG No results found for: PROLACTIN Lab Results  Component Value Date   CHOL 142 09/21/2007   TRIG 121 09/21/2007  HDL 60 09/21/2007   CHOLHDL 2.4 Ratio 09/21/2007   VLDL 24 09/21/2007   LDLCALC 58 09/21/2007   No results found for: TSH  Therapeutic Level Labs: No results found for: LITHIUM No results found for: VALPROATE No components found for:  CBMZ  Current Medications: Current Outpatient Medications  Medication Sig Dispense Refill   arginine 500 MG tablet Take 500 mg by mouth daily.     atomoxetine (STRATTERA) 40 MG capsule Take 1 capsule (40 mg total) by mouth daily. 30  capsule 3   HYDROcodone-acetaminophen (NORCO) 5-325 MG tablet Take 1 tablet by mouth every 6 (six) hours as needed for moderate pain. 10 tablet 0   sertraline (ZOLOFT) 100 MG tablet Take 1 tablet (100 mg total) by mouth daily. 30 tablet 3   No current facility-administered medications for this visit.     Musculoskeletal: Strength & Muscle Tone:  Unable to assess due to telephone visit Gait & Station:  Unable to assess due to telephone visit Patient leans: N/A  Psychiatric Specialty Exam: Review of Systems  There were no vitals taken for this visit.There is no height or weight on file to calculate BMI.  General Appearance:  Unable to assess due to telephone visit  Eye Contact:   Unable to assess due to telephone visit  Speech:  Clear and Coherent and Normal Rate  Volume:  Normal  Mood:  Euthymic  Affect:  Congruent  Thought Process:  Coherent, Goal Directed and Linear  Orientation:  Full (Time, Place, and Person)  Thought Content: WDL and Logical   Suicidal Thoughts:  No  Homicidal Thoughts:  No  Memory:  Immediate;   Good Recent;   Good Remote;   Good  Judgement:  Good  Insight:  Good  Psychomotor Activity:   Unable to assess due to telephone visit  Concentration:  Concentration: Good and Attention Span: Good  Recall:  Good  Fund of Knowledge: Good  Language: Good  Akathisia:   Unable to assess due to telephone visit  Handed:  Right  AIMS (if indicated): Not done  Assets:  Communication Skills Desire for Improvement Financial Resources/Insurance Housing Social Support  ADL's:  Intact  Cognition: WNL  Sleep:  Good   Screenings: GAD-7    [facility]  Total GAD-7 Score 3 0 0      PHQ2-9    Flowsheet Row [facility]  PHQ-2 Total Score 0 0 0  PHQ-9 Total Score 0 0 1      Flowsheet Row [facility]  C-SSRS RISK CATEGORY No Risk        Assessment and Plan: Patient reports that he is doing well on his current medication.  No medication changes made today.  He is agreeable to continuing all medications as prescribed.  1. Attention deficit hyperactivity disorder (ADHD), predominantly inattentive type  Continue- atomoxetine (STRATTERA) 40 MG capsule; Take 1 capsule (40 mg total) by mouth daily.  Dispense: 30 capsule; Refill: 3  2. Depression, major, in remission (HCC)  Continue- sertraline (ZOLOFT) 100 MG tablet; Take 1 tablet (100 mg total) by mouth daily.  Dispense: 30 tablet;  Refill: 3  Follow-up in 3 months  Shanna Cisco, NP 02/25/2021, 11:06 AM

## 2021-05-08 ENCOUNTER — Telehealth (INDEPENDENT_AMBULATORY_CARE_PROVIDER_SITE_OTHER): Payer: No Payment, Other | Admitting: Psychiatry

## 2021-05-08 DIAGNOSIS — F325 Major depressive disorder, single episode, in full remission: Secondary | ICD-10-CM | POA: Diagnosis not present

## 2021-05-08 DIAGNOSIS — F900 Attention-deficit hyperactivity disorder, predominantly inattentive type: Secondary | ICD-10-CM

## 2021-05-08 MED ORDER — SERTRALINE HCL 100 MG PO TABS: 100.0000 mg | ORAL_TABLET | Freq: Every day | ORAL | 2 refills | Status: DC

## 2021-05-08 MED ORDER — ATOMOXETINE HCL 40 MG PO CAPS: 40.0000 mg | ORAL_CAPSULE | Freq: Every day | ORAL | 2 refills | Status: DC

## 2021-05-08 NOTE — Progress Notes (Signed)
BH MD/PA/NP OP Progress Note ? ?05/08/2021 1:20 PM ?Ronald Ferrell  ?MRN:  [id number] ? ?Virtual Visit via Telephone Note ? ?I connected with Ronald Ferrell on 05/08/21 at 10:00 AM EDT by telephone and verified that I am speaking with the correct person using two identifiers. ? ?Location: ?Patient: Home ?Provider: Offsite ?  ?I discussed the limitations, risks, security and privacy concerns of performing an evaluation and management service by telephone and the availability of in person appointments. I also discussed with the patient that there may be a patient responsible charge related to this service. The patient expressed understanding and agreed to proceed. ?  ?I discussed the assessment and treatment plan with the patient. The patient was provided an opportunity to ask questions and all were answered. The patient agreed with the plan and demonstrated an understanding of the instructions. ?  ?The patient was advised to call back or seek an in-person evaluation if the symptoms worsen or if the condition fails to improve as anticipated. ? ?I provided 10 minutes of non-face-to-face time during this encounter. ? ? ?Mcneil Sober, NP  ? ?Chief Complaint: Medication management ? ?HPI: Ronald Ferrell is a 46 year old male presenting to [facility] behavioral health outpatient for follow-up psychiatric evaluation.  He has a psychiatric history of ADHD and bipolar disorder.  Patient symptoms are managed with Strattera 40 mg daily and sertraline 100 mg daily.  Patient reports that medications are effective with managing his symptoms and states that he is medication compliant.  Patient denies adverse effects or need for dosage adjustment today.  No medication changes. ? ?Visit Diagnosis:  ?  ICD-10-CM   ?1. Attention deficit hyperactivity disorder (ADHD), predominantly inattentive type  F90.0 atomoxetine (STRATTERA) 40 MG capsule  ?  ?2. Depression, major, in remission (HCC)  F32.5 sertraline (ZOLOFT) 100 MG tablet  ?  ? ? ?Past  Psychiatric History: See below ? ?Past Medical History:  ?Past Medical History:  ?Diagnosis Date  ? Depression   ? No past surgical history on file. ? ?Family Psychiatric History: None known ? ?Family History: No family history on file. ? ?Social History:  ?Social History  ? ?Socioeconomic History  ? Marital status: Single  ?  Spouse name: Not on file  ? Number of children: Not on file  ? Years of education: Not on file  ? Highest education level: Not on file  ?Occupational History  ? Not on file  ?Tobacco Use  ? Smoking status: Never  ? Smokeless tobacco: Never  ?Substance and Sexual Activity  ? Alcohol use: Yes  ? Drug use: Yes  ?  Types: Marijuana  ? Sexual activity: Not on file  ?Other Topics Concern  ? Not on file  ?Social History Narrative  ? Not on file  ? ?Social Determinants of Health  ? ?Financial Resource Strain: Not on file  ?Food Insecurity: Not on file  ?Transportation Needs: Not on file  ?Physical Activity: Not on file  ?Stress: Not on file  ?Social Connections: Not on file  ? ? ?Allergies: No Known Allergies ? ?Metabolic Disorder Labs: ?No results found for: HGBA1C, MPG ?No results found for: PROLACTIN ?Lab Results  ?Component Value Date  ? CHOL 142 09/21/2007  ? TRIG 121 09/21/2007  ? HDL 60 09/21/2007  ? CHOLHDL 2.4 Ratio 09/21/2007  ? VLDL 24 09/21/2007  ? LDLCALC 58 09/21/2007  ? ?No results found for: TSH ? ?Therapeutic Level Labs: ?No results found for: LITHIUM ?No results found for: VALPROATE ?No components found  for:  CBMZ ? ?Current Medications: ?Current Outpatient Medications  ?Medication Sig Dispense Refill  ? arginine 500 MG tablet Take 500 mg by mouth daily.    ? atomoxetine (STRATTERA) 40 MG capsule Take 1 capsule (40 mg total) by mouth daily. 30 capsule 2  ? HYDROcodone-acetaminophen (NORCO) 5-325 MG tablet Take 1 tablet by mouth every 6 (six) hours as needed for moderate pain. 10 tablet 0  ? sertraline (ZOLOFT) 100 MG tablet Take 1 tablet (100 mg total) by mouth daily. 30 tablet 2   ? ?No current facility-administered medications for this visit.  ? ? ? ?Musculoskeletal: ?Strength & Muscle Tone: N/A virtual visit ?Gait & Station: N/A virtual visit ?Patient leans: N/A ? ?Psychiatric Specialty Exam: ?Review of Systems  ?Psychiatric/Behavioral:  Negative for hallucinations, self-injury and suicidal ideas.   ?All other systems reviewed and are negative.  ?There were no vitals taken for this visit.There is no height or weight on file to calculate BMI.  ?General Appearance: N/A  ?Eye Contact: N/A  ?Speech: Clear and coherent  ?Volume: Normal  ?Mood: Euthymic  ?Affect: N/A  ?Thought Process: Goal directed  ?Orientation:  Full (Time, Place, and Person)  ?Thought Content: Logical   ?Suicidal Thoughts:  No  ?Homicidal Thoughts:  No  ?Memory: Good  ?Judgement: Good  ?Insight: Good  ?Psychomotor Activity: N/A  ?Concentration: Good  ?Recall:  Good  ?Fund of Knowledge: Good  ?Language: Good  ?Akathisia:  NA  ?Handed:  Right  ?AIMS (if indicated): not done  ?Assets:  Communication Skills  ?ADL's:  Intact  ?Cognition: WNL  ?Sleep:  Good  ? ?Screenings: ?GAD-7   ? ?Flowsheet Row Video Visit from 02/25/2021 in [facility] Clinical Support from 05/01/2020 in [facility] Clinical Support from 12/13/2019 in [facility]  ?Total GAD-7 Score 3 0 0  ? ?  ? ?PHQ2-9   ? ?Flowsheet Row Video Visit from 02/25/2021 in [facility] Clinical Support from 05/01/2020 in [facility] Clinical Support from 12/13/2019 in [facility]  ?PHQ-2 Total Score 0 0 0  ?PHQ-9 Total Score 0 0 1  ? ?  ? ?Flowsheet Row Clinical Support from 05/01/2020 in [facility]  ?C-SSRS RISK CATEGORY No Risk  ? ?  ? ? ? ?Assessment and Plan: Ronald Ferrell is a 46 year old male presenting to [facility] behavioral health outpatient for follow-up psychiatric  evaluation.  He has a psychiatric history of ADHD and bipolar disorder.  Patient symptoms are managed with Strattera 40 mg daily and sertraline 100 mg daily.  Patient reports that medications are effective with managing his symptoms and states that he is medication compliant.  Patient denies adverse effects or need for dosage adjustment today.  No medication changes.  Medications refilled at current dosages. ? ?Collaboration of Care: Collaboration of Care: Medication Management AEB medications E scribed to patient's preferred pharmacy. ? ?1. Attention deficit hyperactivity disorder (ADHD), predominantly inattentive type ? ?- atomoxetine (STRATTERA) 40 MG capsule; Take 1 capsule (40 mg total) by mouth daily.  Dispense: 30 capsule; Refill: 2 ? ?2. Depression, major, in remission (HCC) ? ?- sertraline (ZOLOFT) 100 MG tablet; Take 1 tablet (100 mg total) by mouth daily.  Dispense: 30 tablet; Refill: 2  ? ? ?Return to care in 3 months ? ?Patient/Guardian was advised Release of Information must be obtained prior to any record release in order to collaborate their care with  an outside provider. Patient/Guardian was advised if they have not already done so to contact the registration department to sign all necessary forms in order for Ronald Ferrell to release information regarding their care.  ? ?Consent: Patient/Guardian gives verbal consent for treatment and assignment of benefits for services provided during this visit. Patient/Guardian expressed understanding and agreed to proceed.  ? ? ?Mcneil Sobericely Cheney Ewart, NP ?05/08/2021, 1:20 PM ? ?

## 2021-07-30 ENCOUNTER — Encounter (HOSPITAL_COMMUNITY): Payer: Self-pay | Admitting: Psychiatry

## 2021-07-30 ENCOUNTER — Telehealth (INDEPENDENT_AMBULATORY_CARE_PROVIDER_SITE_OTHER): Payer: Self-pay | Admitting: Psychiatry

## 2021-07-30 DIAGNOSIS — F900 Attention-deficit hyperactivity disorder, predominantly inattentive type: Secondary | ICD-10-CM | POA: Diagnosis not present

## 2021-07-30 DIAGNOSIS — F325 Major depressive disorder, single episode, in full remission: Secondary | ICD-10-CM

## 2021-07-30 MED ORDER — SERTRALINE HCL 100 MG PO TABS: 100.0000 mg | ORAL_TABLET | Freq: Every day | ORAL | 3 refills | Status: DC

## 2021-07-30 MED ORDER — ATOMOXETINE HCL 40 MG PO CAPS: 40.0000 mg | ORAL_CAPSULE | Freq: Every day | ORAL | 3 refills | Status: DC

## 2021-07-30 NOTE — Progress Notes (Signed)
BH MD/PA/NP OP Progress Note Virtual Visit via Telephone Note  I connected with Ronald Ferrell on 07/30/21 at 11:00 AM EDT by telephone and verified that I am speaking with the correct person using two identifiers.  Location: Patient: Home Provider: Clinic   I discussed the limitations, risks, security and privacy concerns of performing an evaluation and management service by telephone and the availability of in person appointments. I also discussed with the patient that there may be a patient responsible charge related to this service. The patient expressed understanding and agreed to proceed.   I provided 30 minutes of non-face-to-face time during this encounter.  07/30/2021 11:09 AM Ronald Ferrell  MRN:  [id number]  Chief Complaint: "I feel good"  HPI: 46 year old male  seen today for follow-up psychiatric evaluation. He has a psychiatric history of ADHD, depression, and bipolar 1 disorder.  He is currently managed on Strattera 40 mg daily and Zoloft 100 mg daily in the morning. Patient reports that his current medication regimen is  effective in managing his psychiatric conditions.   Today was unable to login virtually so assessment was done over the phone.  During exam he was pleasant, cooperative, and engaged in conversation. He informed provider that he feels good. 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 Patient informed writer that his mood is stable and reports that he has minimal anxiety and depression.  Provider conducted a GAD-7 and patient scored a 0,   Provider also conducted PHQ-9 and patient scored a 0. He endorses adequate sleep and appetite.  Today he denies any SI/HI/VAH, mania, or paranoia.   No medication changes made today.  Patient agreeable to continue medication as prescribed.  No other concerns noted at this time.   Visit Diagnosis:     ICD-10-CM   1. Attention deficit hyperactivity disorder (ADHD), predominantly inattentive type  F90.0 atomoxetine (STRATTERA) 40 MG capsule    2. Depression, major, in remission (HCC)  F32.5 sertraline (ZOLOFT) 100 MG tablet      Past Psychiatric History: Bipolar disorder and ADD Past Medical History:  Past Medical History:  Diagnosis Date   Depression    History reviewed. No pertinent surgical history.  Family Psychiatric History: Denies  Family History: History reviewed. No pertinent family history.  Social History:  Social History   Socioeconomic History   Marital status: Single    Spouse name: Not on file   Number of children: Not on file   Years of education: Not on file   Highest education level: Not on file  Occupational History   Not on file  Tobacco Use   Smoking status: Never   Smokeless tobacco: Never  Substance and Sexual Activity   Alcohol use: Yes   Drug use: Yes    Types: Marijuana   Sexual activity: Not on file  Other Topics Concern   Not on file  Social History Narrative   Not on file   Social Determinants of Health   Financial Resource Strain: Not on file  Food Insecurity: Not on file  Transportation Needs: Not on file  Physical Activity: Not on file  Stress: Not on file  Social Connections: Not on file    Allergies: No Known Allergies  Metabolic Disorder Labs: No results found for: "HGBA1C", "MPG" No results found for: "PROLACTIN" Lab Results  Component Value Date   CHOL 142  09/21/2007   TRIG 121 09/21/2007   HDL 60 09/21/2007   CHOLHDL 2.4 Ratio 09/21/2007   VLDL 24 09/21/2007   LDLCALC 58 09/21/2007   No results found for: "TSH"  Therapeutic Level Labs: No results found for: "LITHIUM" No results found for: "VALPROATE" No results found for: "CBMZ"  Current Medications: Current Outpatient Medications  Medication Sig Dispense Refill   arginine 500 MG tablet Take 500 mg by mouth daily.     atomoxetine (STRATTERA) 40 MG  capsule Take 1 capsule (40 mg total) by mouth daily. 30 capsule 3   HYDROcodone-acetaminophen (NORCO) 5-325 MG tablet Take 1 tablet by mouth every 6 (six) hours as needed for moderate pain. 10 tablet 0   sertraline (ZOLOFT) 100 MG tablet Take 1 tablet (100 mg total) by mouth daily. 30 tablet 3   No current facility-administered medications for this visit.     Musculoskeletal: Strength & Muscle Tone:  Unable to assess due to telephone visit Gait & Station:  Unable to assess due to telephone visit Patient leans: N/A  Psychiatric Specialty Exam: Review of Systems  There were no vitals taken for this visit.There is no height or weight on file to calculate BMI.  General Appearance:  Unable to assess due to telephone visit  Eye Contact:   Unable to assess due to telephone visit  Speech:  Clear and Coherent and Normal Rate  Volume:  Normal  Mood:  Euthymic  Affect:  Congruent  Thought Process:  Coherent, Goal Directed and Linear  Orientation:  Full (Time, Place, and Person)  Thought Content: WDL and Logical   Suicidal Thoughts:  No  Homicidal Thoughts:  No  Memory:  Immediate;   Good Recent;   Good Remote;   Good  Judgement:  Good  Insight:  Good  Psychomotor Activity:   Unable to assess due to telephone visit  Concentration:  Concentration: Good and Attention Span: Good  Recall:  Good  Fund of Knowledge: Good  Language: Good  Akathisia:   Unable to assess due to telephone visit  Handed:  Right  AIMS (if indicated): Not done  Assets:  Communication Skills Desire for Improvement Financial Resources/Insurance Housing Social Support  ADL's:  Intact  Cognition: WNL  Sleep:  Good   Screenings: GAD-7    Flowsheet Row Video Visit from 07/30/2021 in [facility] Video Visit from 02/25/2021 in [facility] Clinical Support from 05/01/2020 in [facility] Clinical Support from 12/13/2019 in [facility]  Total GAD-7 Score 0 3 0 0      PHQ2-9    Flowsheet Row Video Visit from 07/30/2021 in [facility] Video Visit from 02/25/2021 in [facility] Clinical Support from 05/01/2020 in [facility] Clinical Support from 12/13/2019 in [facility]  PHQ-2 Total Score 0 0 0 0  PHQ-9 Total Score 0 0 0 1      Flowsheet Row Clinical Support from 05/01/2020 in [facility]  C-SSRS RISK CATEGORY No Risk        Assessment and Plan: Patient reports that he is doing well on his current medication.  No medication changes made today.  He is agreeable to continuing all medications as prescribed.  1. Attention deficit hyperactivity disorder (ADHD), predominantly inattentive type  Continue- atomoxetine (STRATTERA) 40 MG capsule; Take 1 capsule (40 mg total) by mouth daily.  Dispense: 30  capsule; Refill: 3  2. Depression, major, in remission (HCC)  Continue- sertraline (ZOLOFT) 100 MG tablet; Take 1 tablet (100 mg total) by mouth daily.  Dispense: 30 tablet; Refill: 3  Follow-up in 3 months  Shanna Cisco, NP 07/30/2021, 11:09 AM

## 2021-10-29 ENCOUNTER — Encounter (HOSPITAL_COMMUNITY): Payer: Self-pay | Admitting: Psychiatry

## 2021-10-29 ENCOUNTER — Ambulatory Visit (INDEPENDENT_AMBULATORY_CARE_PROVIDER_SITE_OTHER): Payer: Self-pay | Admitting: Psychiatry

## 2021-10-29 DIAGNOSIS — F325 Major depressive disorder, single episode, in full remission: Secondary | ICD-10-CM

## 2021-10-29 DIAGNOSIS — F900 Attention-deficit hyperactivity disorder, predominantly inattentive type: Secondary | ICD-10-CM

## 2021-10-29 MED ORDER — SERTRALINE HCL 100 MG PO TABS: 100.0000 mg | ORAL_TABLET | Freq: Every day | ORAL | 3 refills | Status: DC

## 2021-10-29 MED ORDER — ATOMOXETINE HCL 40 MG PO CAPS: 40.0000 mg | ORAL_CAPSULE | Freq: Every day | ORAL | 3 refills | Status: DC

## 2021-10-29 NOTE — Progress Notes (Signed)
BH MD/PA/NP OP Progress Note Virtual Visit via Telephone Note  I connected with Ronald Ferrell on 10/29/21 at  9:30 AM EDT by telephone and verified that I am speaking with the correct person using two identifiers.  Location: Patient: Home Provider: Clinic   I discussed the limitations, risks, security and privacy concerns of performing an evaluation and management service by telephone and the availability of in person appointments. I also discussed with the patient that there may be a patient responsible charge related to this service. The patient expressed understanding and agreed to proceed.   I provided 30 minutes of non-face-to-face time during this encounter.  10/29/2021 9:43 AM Ronald Ferrell  MRN:  [id number]  Chief Complaint: "I am a little nervous about my new job"  HPI: 46 year old male  seen today for follow-up psychiatric evaluation. He has a psychiatric history of ADHD, depression, and bipolar 1 disorder.  He is currently managed on Strattera 40 mg daily and Zoloft 100 mg daily in the morning. Patient reports that his current medications are effective in managing his psychiatric conditions.   Today was unable to login virtually so assessment was done over the phone.  During exam he was pleasant, cooperative, and engaged in conversation. He informed provider that he is a little concerned about his new job.  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  Patient informed Clinical research associate that he continues to stay active and is still interested in working as a Pharmacist, community.  He reports that his mood is stable and notes that he has minimal anxiety and depression.  Provider conducted a GAD-7 and patient scored a 2, at his last visit he scored a 0.  Provider also conducted PHQ-9 and patient scored a 0, at his last visit he scored a 0. He endorses adequate sleep and appetite.  Today he denies any SI/HI/VAH, mania, or paranoia.    No medication changes made today.  Patient agreeable to continue medication as prescribed.  No other concerns noted at this time.   Visit Diagnosis:    ICD-10-CM   1. Attention deficit hyperactivity disorder (ADHD), predominantly inattentive type  F90.0 atomoxetine (STRATTERA) 40 MG capsule    2. Depression, major, in remission (HCC)  F32.5 sertraline (ZOLOFT) 100 MG tablet      Past Psychiatric History: Bipolar disorder and ADD Past Medical History:  Past Medical History:  Diagnosis Date   Depression    History reviewed. No pertinent surgical history.  Family Psychiatric History: Denies  Family History: History reviewed. No pertinent family history.  Social History:  Social History   Socioeconomic History   Marital status: Single    Spouse name: Not on file   Number of children: Not on file   Years of education: Not on file   Highest education level: Not on file  Occupational History   Not on file  Tobacco Use   Smoking status: Never   Smokeless tobacco: Never  Substance and Sexual Activity   Alcohol use: Yes   Drug use: Yes    Types: Marijuana   Sexual activity: Not on file  Other Topics Concern   Not on file  Social History Narrative   Not on file   Social Determinants of Health   Financial Resource Strain: Not on file  Food Insecurity: Not on file  Transportation Needs: Not on file  Physical Activity: Not on file  Stress: Not on file  Social Connections: Not on file    Allergies: No Known Allergies  Metabolic Disorder Labs: No results found for: "HGBA1C", "MPG" No results found for: "PROLACTIN" Lab Results  Component Value Date   CHOL 142 09/21/2007   TRIG 121 09/21/2007   HDL 60 09/21/2007   CHOLHDL 2.4 Ratio 09/21/2007   VLDL 24 09/21/2007   LDLCALC 58 09/21/2007   No results found for: "TSH"  Therapeutic Level Labs: No results found for: "LITHIUM" No results found for: "VALPROATE" No results found for: "CBMZ"  Current  Medications: Current Outpatient Medications  Medication Sig Dispense Refill   arginine 500 MG tablet Take 500 mg by mouth daily.     atomoxetine (STRATTERA) 40 MG capsule Take 1 capsule (40 mg total) by mouth daily. 30 capsule 3   HYDROcodone-acetaminophen (NORCO) 5-325 MG tablet Take 1 tablet by mouth every 6 (six) hours as needed for moderate pain. 10 tablet 0   sertraline (ZOLOFT) 100 MG tablet Take 1 tablet (100 mg total) by mouth daily. 30 tablet 3   No current facility-administered medications for this visit.     Musculoskeletal: Strength & Muscle Tone:  Unable to assess due to telephone visit Gait & Station:  Unable to assess due to telephone visit Patient leans: N/A  Psychiatric Specialty Exam: Review of Systems  There were no vitals taken for this visit.There is no height or weight on file to calculate BMI.  General Appearance:  Unable to assess due to telephone visit  Eye Contact:   Unable to assess due to telephone visit  Speech:  Clear and Coherent and Normal Rate  Volume:  Normal  Mood:  Euthymic  Affect:  Congruent  Thought Process:  Coherent, Goal Directed and Linear  Orientation:  Full (Time, Place, and Person)  Thought Content: WDL and Logical   Suicidal Thoughts:  No  Homicidal Thoughts:  No  Memory:  Immediate;   Good Recent;   Good Remote;   Good  Judgement:  Good  Insight:  Good  Psychomotor Activity:   Unable to assess due to telephone visit  Concentration:  Concentration: Good and Attention Span: Good  Recall:  Good  Fund of Knowledge: Good  Language: Good  Akathisia:   Unable to assess due to telephone visit  Handed:  Right  AIMS (if indicated): Not done  Assets:  Communication Skills Desire for Improvement Financial Resources/Insurance Housing Social Support  ADL's:  Intact  Cognition: WNL  Sleep:  Good   Screenings: GAD-7    Flowsheet Row Clinical Support from 10/29/2021 in [facility] Video Visit from  07/30/2021 in [facility] Video Visit from 02/25/2021 in [facility] Clinical Support from 05/01/2020 in [facility] Clinical Support from 12/13/2019 in [facility]  Total GAD-7 Score 2 0 3 0 0      PHQ2-9    Flowsheet Row Clinical Support from 10/29/2021 in [facility] Video Visit from 07/30/2021 in [facility] Video Visit from 02/25/2021 in [facility] Clinical Support from 05/01/2020 in [facility] Clinical Support from 12/13/2019 in [facility]  PHQ-2 Total Score 0 0 0 0 0  PHQ-9 Total Score 0 0 0 0 1      Flowsheet Row Clinical Support from 05/01/2020 in [facility]  C-SSRS RISK CATEGORY No Risk  Assessment and Plan: Patient reports that he is doing well on his current medication.  No medication changes made today.  He is agreeable to continuing all medications as prescribed.  1. Attention deficit hyperactivity disorder (ADHD), predominantly inattentive type  Continue- atomoxetine (STRATTERA) 40 MG capsule; Take 1 capsule (40 mg total) by mouth daily.  Dispense: 30 capsule; Refill: 3  2. Depression, major, in remission ([place])  Continue- sertraline (ZOLOFT) 100 MG tablet; Take 1 tablet (100 mg total) by mouth daily.  Dispense: 30 tablet; Refill: 3  Follow-up in 3 months  Salley Slaughter, NP 10/29/2021, 9:43 AM

## 2022-01-12 ENCOUNTER — Encounter (HOSPITAL_COMMUNITY): Payer: No Payment, Other | Admitting: Psychiatry

## 2022-02-18 ENCOUNTER — Other Ambulatory Visit (HOSPITAL_COMMUNITY): Payer: Self-pay | Admitting: Psychiatry

## 2022-02-18 DIAGNOSIS — F325 Major depressive disorder, single episode, in full remission: Secondary | ICD-10-CM

## 2022-02-18 DIAGNOSIS — F900 Attention-deficit hyperactivity disorder, predominantly inattentive type: Secondary | ICD-10-CM

## 2022-02-26 ENCOUNTER — Telehealth (HOSPITAL_COMMUNITY): Payer: Self-pay | Admitting: Psychiatry

## 2022-02-26 ENCOUNTER — Other Ambulatory Visit (HOSPITAL_COMMUNITY): Payer: Self-pay | Admitting: Psychiatry

## 2022-02-26 DIAGNOSIS — F325 Major depressive disorder, single episode, in full remission: Secondary | ICD-10-CM

## 2022-02-26 DIAGNOSIS — F900 Attention-deficit hyperactivity disorder, predominantly inattentive type: Secondary | ICD-10-CM

## 2022-02-26 MED ORDER — ATOMOXETINE HCL 40 MG PO CAPS: 40.0000 mg | ORAL_CAPSULE | Freq: Every day | ORAL | 3 refills | Status: DC

## 2022-02-26 MED ORDER — SERTRALINE HCL 100 MG PO TABS: 100.0000 mg | ORAL_TABLET | Freq: Every day | ORAL | 3 refills | Status: DC

## 2022-02-26 NOTE — Telephone Encounter (Signed)
Patient came into the office and scheduled a visit with Dr Rosita Kea and also requested a refill of atomoxetine (STRATTERA) 40 MG capsule and  sertraline (ZOLOFT) 100 MG tablet.  Pharmacy:  [facility] [address] [place] Phone: [phone]  Fax: [phone]

## 2022-02-26 NOTE — Telephone Encounter (Signed)
Medication refilled and sent to preferred pharmacy

## 2022-03-13 ENCOUNTER — Encounter (HOSPITAL_COMMUNITY): Payer: No Payment, Other | Admitting: Psychiatry

## 2022-07-02 ENCOUNTER — Ambulatory Visit (INDEPENDENT_AMBULATORY_CARE_PROVIDER_SITE_OTHER): Payer: No Payment, Other | Admitting: Psychiatry

## 2022-07-02 ENCOUNTER — Encounter (HOSPITAL_COMMUNITY): Payer: Self-pay | Admitting: Psychiatry

## 2022-07-02 VITALS — BP 135/82 | HR 90 | Wt 211.0 lb

## 2022-07-02 DIAGNOSIS — F1010 Alcohol abuse, uncomplicated: Secondary | ICD-10-CM | POA: Diagnosis not present

## 2022-07-02 DIAGNOSIS — F900 Attention-deficit hyperactivity disorder, predominantly inattentive type: Secondary | ICD-10-CM

## 2022-07-02 DIAGNOSIS — F325 Major depressive disorder, single episode, in full remission: Secondary | ICD-10-CM

## 2022-07-02 DIAGNOSIS — F1210 Cannabis abuse, uncomplicated: Secondary | ICD-10-CM

## 2022-07-02 MED ORDER — THIAMINE HCL 100 MG PO TABS: 100.0000 mg | ORAL_TABLET | Freq: Every day | ORAL | 1 refills | Status: AC

## 2022-07-02 MED ORDER — SERTRALINE HCL 100 MG PO TABS: 100.0000 mg | ORAL_TABLET | Freq: Every day | ORAL | 1 refills | Status: DC

## 2022-07-02 MED ORDER — ATOMOXETINE HCL 40 MG PO CAPS: 40.0000 mg | ORAL_CAPSULE | Freq: Every day | ORAL | 1 refills | Status: DC

## 2022-07-02 NOTE — Progress Notes (Addendum)
BH MD/PA/NP OP Progress Note  07/02/2022 11:46 AM Ronald Ferrell  MRN:  [id number]  Chief Complaint:  Chief Complaint  Patient presents with   Follow-up   Medication Refill   HPI: 47 year old male presented today as a walk-in for medication refills. He has a psychiatric history of ADHD, depression, and bipolar disorder.  He is currently managed on Strattera 40 mg daily and Zoloft 100 mg daily in the morning. Patient reports was following with Karen Kitchens NP since 2021.    Pt reports that his mood is " stable". He denies any depression or anxiety.  Denies any manic symptoms.  He reports that his mom died in 04-20-2024health but he has been doing well and his mood is stable. He denies any other stressors at this time. He moved out of his parents house and now living in an apartment with a roommate.  He has been sleeping and eating well .  Currently, He denies any suicidal ideations, homicidal ideations, auditory and visual hallucinations.  He denies paranoia.  He denies any medication side effects and has been tolerating it well.  He reports that when he does not take his medications he feels lazy, depressed, has low motivation and has difficulty in processing his thoughts.  He reports that his ADHD was diagnosed in 56's and he was following at [facility] before [facility].  He states he was having difficulty in completing his work and was feeling irritable before starting these medications but he does not remember much as it was long time ago. He is not reporting any recent manic episodes.  He has tried Wellbutrin in the past which did not work. 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  He drinks alcohol 1-2 beer every day and 7-8 beers on weekends.  Discussed cutting down on alcohol.  He reports smoking marijuana daily.  Denies using any other illicit drugs.  Educated about the negative effects of marijuana and recommend complete cessation. Patient denies any need for change in  medication and medication dosages and wants to continue same meds.  He denies any other concerns.  Patient is alert and oriented x 4,  calm, cooperative, and fully engaged in conversation during the encounter.  His thought process is coherent with coherent speech . He does not appear to be responding to internal/external stimuli .    Visit Diagnosis:    ICD-10-CM   1. Depression, major, in remission (HCC)  F32.5 sertraline (ZOLOFT) 100 MG tablet    Vitamin D (25 hydroxy)    TSH    CBC w/Diff/Platelet    COMPLETE METABOLIC PANEL WITH GFR    2. Attention deficit hyperactivity disorder (ADHD), predominantly inattentive type  F90.0 atomoxetine (STRATTERA) 40 MG capsule    Vitamin D (25 hydroxy)    TSH    CBC w/Diff/Platelet    COMPLETE METABOLIC PANEL WITH GFR    3. Cannabis abuse  F12.10     4. Alcohol consumption binge drinking  F10.10 Vitamin B12    Folate    thiamine (VITAMIN B1) 100 MG tablet      Past Psychiatric History: Depression and ADD  Denies past suicidal attempts and psychiatric hospitalizations   Past Medical History:  Past Medical History:  Diagnosis Date   Depression    No past surgical history on file.  Family Psychiatric History: none  Family History: No family history on file.  Social History:  Social History   Socioeconomic History   Marital  status: Single    Spouse name: Not on file   Number of children: Not on file   Years of education: Not on file   Highest education level: Not on file  Occupational History   Not on file  Tobacco Use   Smoking status: Never   Smokeless tobacco: Never  Substance and Sexual Activity   Alcohol use: Yes    Alcohol/week: 2.0 standard drinks of alcohol    Types: 2 Cans of beer per week    Comment: 2 DAILY  AND 7-8 ON WEEKEND   Drug use: Yes    Frequency: 7.0 times per week    Types: Marijuana   Sexual activity: Not on file  Other Topics Concern   Not on file  Social History Narrative   Not on file    Social Determinants of Health   Financial Resource Strain: Not on file  Food Insecurity: Not on file  Transportation Needs: Not on file  Physical Activity: Not on file  Stress: Not on file  Social Connections: Not on file    Allergies: No Known Allergies  Metabolic Disorder Labs: No results found for: "HGBA1C", "MPG" No results found for: "PROLACTIN" Lab Results  Component Value Date   CHOL 142 09/21/2007   TRIG 121 09/21/2007   HDL 60 09/21/2007   CHOLHDL 2.4 Ratio 09/21/2007   VLDL 24 09/21/2007   LDLCALC 58 09/21/2007   No results found for: "TSH"  Therapeutic Level Labs: No results found for: "LITHIUM" No results found for: "VALPROATE" No results found for: "CBMZ"  Current Medications: Current Outpatient Medications  Medication Sig Dispense Refill   thiamine (VITAMIN B1) 100 MG tablet Take 1 tablet (100 mg total) by mouth daily. 30 tablet 1   arginine 500 MG tablet Take 500 mg by mouth daily.     atomoxetine (STRATTERA) 40 MG capsule Take 1 capsule (40 mg total) by mouth daily. 30 capsule 1   HYDROcodone-acetaminophen (NORCO) 5-325 MG tablet Take 1 tablet by mouth every 6 (six) hours as needed for moderate pain. 10 tablet 0   sertraline (ZOLOFT) 100 MG tablet Take 1 tablet (100 mg total) by mouth daily. 30 tablet 1   No current facility-administered medications for this visit.     Musculoskeletal: Strength & Muscle Tone: within normal limits Gait & Station: normal Patient leans: N/A  Psychiatric Specialty Exam: Review of Systems  Blood pressure 135/82, pulse 90, weight 211 lb 0.3 oz (95.7 kg), SpO2 100 %.Body mass index is 32.09 kg/m.  General Appearance: Casual  Eye Contact:  Fair  Speech:  Clear and Coherent and Normal Rate  Volume:  Normal  Mood:  Euthymic  Affect:  Full Range  Thought Process:  Coherent  Orientation:  Full (Time, Place, and Person)  Thought Content: Logical   Suicidal Thoughts:  No  Homicidal Thoughts:  No  Memory:  Grossly  intact  Judgement:  Fair  Insight:  Fair  Psychomotor Activity:  Normal  Concentration:  Concentration: Good and Attention Span: Good  Recall:  Good  Fund of Knowledge: Good  Language: Good  Akathisia:  No  Handed:    AIMS (if indicated): not done  Assets:  Communication Skills Desire for Improvement Housing Physical Health Talents/Skills  ADL's:  Intact  Cognition: WNL  Sleep:  Good   Screenings: GAD-7    Flowsheet Row Clinical Support from 10/29/2021 in [facility] Video Visit from 07/30/2021 in [facility] Video Visit from 02/25/2021 in  [facility] Clinical Support from 05/01/2020 in [facility] Clinical Support from 12/13/2019 in [facility]  Total GAD-7 Score 2 0 3 0 0      PHQ2-9    Flowsheet Row Clinical Support from 10/29/2021 in [facility] Video Visit from 07/30/2021 in [facility] Video Visit from 02/25/2021 in [facility] Clinical Support from 05/01/2020 in [facility] Clinical Support from 12/13/2019 in [facility]  PHQ-2 Total Score 0 0 0 0 0  PHQ-9 Total Score 0 0 0 0 1      Flowsheet Row Clinical Support from 05/01/2020 in [facility]  C-SSRS RISK CATEGORY No Risk        Assessment and Plan: 47 year old male presented today as a walk-in for medication refills. He has a psychiatric history of ADHD, depression, and bipolar disorder.  He is currently managed on Strattera 40 mg daily and Zoloft 100 mg daily in the morning. Patient reports was following with Karen Kitchens NP since 2021.  Patient has been doing well on current medications.  Denies any side effects.  Reports that medication helps with his motivation, concentration and mood.  Will continue  current medications.  Will order routine labs CBC, CMP, vitamin D level and TSH to rule out any organic causes of depression and ADD.  1. Depression, major, in remission (HCC)  -Continue sertraline (ZOLOFT) 100 MG tablet; Take 1 tablet (100 mg total) by mouth daily.  Dispense: 30 tablet; Refill: 1 - Ordered routine CBC, CMP, vitamin D level, TSH.  2. Attention deficit hyperactivity disorder (ADHD), predominantly inattentive type  -Continue atomoxetine (STRATTERA) 40 MG capsule; Take 1 capsule (40 mg total) by mouth daily.  Dispense: 30 capsule; Refill: 1   3. Cannabis abuse -Recommend complete cessation.  Educated about the negative effects of cannabis  4. Alcohol consumption binge drinking -Recommend cutting down on alcohol use -Start thiamine 100 mg daily. -Ordered Vitamin B12 -Ordered folate   Transfer of care: Informed patient that provider will be leaving in June and patient 's care will be transferred to another provider beginning July.   For follow-up in 2 months.  Collaboration of Care: Collaboration of Care: Other Dr Adrian Blackwater  Patient/Guardian was advised Release of Information must be obtained prior to any record release in order to collaborate their care with an outside provider. Patient/Guardian was advised if they have not already done so to contact the registration department to sign all necessary forms in order for [place] to release information regarding their care.   Consent: Patient/Guardian gives verbal consent for treatment and assignment of benefits for services provided during this visit. Patient/Guardian expressed understanding and agreed to proceed.    Karsten Ro, MD 07/02/2022, 11:46 AM

## 2022-07-15 ENCOUNTER — Other Ambulatory Visit (HOSPITAL_COMMUNITY): Payer: No Payment, Other

## 2022-07-15 NOTE — Progress Notes (Signed)
Patient presents to the office for labs , labs were drawn by Lupita Leash with no complaints

## 2022-07-16 ENCOUNTER — Other Ambulatory Visit (HOSPITAL_COMMUNITY): Payer: Self-pay | Admitting: Psychiatry

## 2022-07-21 ENCOUNTER — Encounter (HOSPITAL_COMMUNITY): Payer: Self-pay

## 2022-07-23 LAB — CBC WITH DIFFERENTIAL/PLATELET
Basophils Absolute: 0 10*3/uL (ref 0.0–0.2)
Basos: 1 %
EOS (ABSOLUTE): 0.1 10*3/uL (ref 0.0–0.4)
Eos: 2 %
Hematocrit: 42.9 % (ref 37.5–51.0)
Hemoglobin: 13.6 g/dL (ref 13.0–17.7)
Immature Grans (Abs): 0 10*3/uL (ref 0.0–0.1)
Immature Granulocytes: 0 %
Lymphocytes Absolute: 3 10*3/uL (ref 0.7–3.1)
Lymphs: 48 %
MCH: 28.9 pg (ref 26.6–33.0)
MCHC: 31.7 g/dL (ref 31.5–35.7)
MCV: 91 fL (ref 79–97)
Monocytes Absolute: 0.5 10*3/uL (ref 0.1–0.9)
Monocytes: 8 %
Neutrophils Absolute: 2.6 10*3/uL (ref 1.4–7.0)
Neutrophils: 41 %
Platelets: 397 10*3/uL (ref 150–450)
RBC: 4.71 x10E6/uL (ref 4.14–5.80)
RDW: 14.3 % (ref 11.6–15.4)
WBC: 6.3 10*3/uL (ref 3.4–10.8)

## 2022-07-23 LAB — VITAMIN D 25 HYDROXY (VIT D DEFICIENCY, FRACTURES): Vit D, 25-Hydroxy: 10 ng/mL — ABNORMAL LOW (ref 30.0–100.0)

## 2022-07-23 LAB — FOLATE: Folate: 9.5 ng/mL (ref >3.0)

## 2022-07-23 LAB — SPECIMEN STATUS REPORT

## 2022-07-23 LAB — TSH: TSH: 4.27 u[IU]/mL (ref 0.450–4.500)

## 2022-07-23 LAB — VITAMIN B12: Vitamin B-12: 716 pg/mL (ref 232–1245)

## 2022-07-24 LAB — COMPREHENSIVE METABOLIC PANEL
ALT: 20 IU/L (ref 0–44)
AST: 21 IU/L (ref 0–40)
Albumin: 4.9 g/dL (ref 4.1–5.1)
Alkaline Phosphatase: 68 IU/L (ref 44–121)
BUN/Creatinine Ratio: 10 (ref 9–20)
BUN: 12 mg/dL (ref 6–24)
Bilirubin Total: <0.2 mg/dL (ref 0.0–1.2)
CO2: 17 mmol/L — ABNORMAL LOW (ref 20–29)
Calcium: 10 mg/dL (ref 8.7–10.2)
Chloride: 102 mmol/L (ref 96–106)
Creatinine, Ser: 1.19 mg/dL (ref 0.76–1.27)
Globulin, Total: 2.4 g/dL (ref 1.5–4.5)
Glucose: 92 mg/dL (ref 70–99)
Potassium: 4.8 mmol/L (ref 3.5–5.2)
Sodium: 142 mmol/L (ref 134–144)
Total Protein: 7.3 g/dL (ref 6.0–8.5)
eGFR: 77 mL/min/{1.73_m2} (ref >59)

## 2022-07-24 LAB — SPECIMEN STATUS REPORT

## 2022-08-18 ENCOUNTER — Encounter (HOSPITAL_COMMUNITY): Payer: No Payment, Other | Admitting: Psychiatry

## 2022-08-18 ENCOUNTER — Telehealth (HOSPITAL_COMMUNITY): Payer: Self-pay | Admitting: Physician Assistant

## 2022-08-18 ENCOUNTER — Encounter (HOSPITAL_COMMUNITY): Payer: No Payment, Other | Admitting: Physician Assistant

## 2022-08-18 NOTE — Telephone Encounter (Signed)
Message acknowledged and reviewed.

## 2022-10-15 ENCOUNTER — Encounter (HOSPITAL_COMMUNITY): Payer: Self-pay

## 2022-10-16 ENCOUNTER — Encounter (HOSPITAL_COMMUNITY): Payer: Self-pay | Admitting: Psychiatry

## 2022-10-16 ENCOUNTER — Ambulatory Visit (INDEPENDENT_AMBULATORY_CARE_PROVIDER_SITE_OTHER): Payer: No Payment, Other | Admitting: Psychiatry

## 2022-10-16 DIAGNOSIS — F325 Major depressive disorder, single episode, in full remission: Secondary | ICD-10-CM

## 2022-10-16 DIAGNOSIS — F411 Generalized anxiety disorder: Secondary | ICD-10-CM | POA: Diagnosis not present

## 2022-10-16 DIAGNOSIS — F900 Attention-deficit hyperactivity disorder, predominantly inattentive type: Secondary | ICD-10-CM | POA: Diagnosis not present

## 2022-10-16 DIAGNOSIS — F1010 Alcohol abuse, uncomplicated: Secondary | ICD-10-CM

## 2022-10-16 MED ORDER — SERTRALINE HCL 100 MG PO TABS: 100.0000 mg | ORAL_TABLET | Freq: Every day | ORAL | 3 refills | Status: DC

## 2022-10-16 MED ORDER — ATOMOXETINE HCL 40 MG PO CAPS: 40.0000 mg | ORAL_CAPSULE | Freq: Every day | ORAL | 3 refills | Status: DC

## 2022-10-16 NOTE — Progress Notes (Signed)
BH MD/PA/NP OP Progress Note    Virtual Visit via Video Note  I connected with Ronald Ferrell on 10/16/22 at 10:00 AM EDT by a video enabled telemedicine application and verified that I am speaking with the correct person using two identifiers.  Location: Patient: Home Provider: Clinic   I discussed the limitations of evaluation and management by telemedicine and the availability of in person appointments. The patient expressed understanding and agreed to proceed.  I provided 45 minutes of non-face-to-face time during this encounter.    10/16/2022 8:50 AM Ronald Ferrell  MRN:  [id number]  Chief Complaint: "I have been out of my medications"  HPI: 47 year old male  seen today for follow-up psychiatric evaluation. He has a psychiatric history of ADHD, depression, and bipolar 1 disorder.  He is currently managed on Strattera 40 mg daily and Zoloft 100 mg daily in the morning. Patient notes that he been out of his medications for a month.   Today was well groomed, pleasant, cooperative, and engaged in conversation. Patient note that since being out of his medications he has been more irritable. He notes that he has also been having mood swings and rapid speech. Patient also notes that he has had many life changes. He notes that his mother died in 05/24/2022. He notes that he has moved out of his parents house. He now lives in an apartment of his own. 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 He notes that he finds enjoyment in his job.   Patient informed Clinical research associate that he is concerned about his drinking. He notes that he drinks 3-4 beers nightly.  Provider conducted an audit assessment and patient scored a 13.   He reports that he is interested in naltrexone to help manage his cravings.  Provider informed patient that a UDS need to be conducted first to ensure that he is not on opioids.  He endorsed understanding.  Despite the above patient notes that his anxiety and depression are  well-managed.  Provider conducted a GAD-7 and patient scored a 6.  Provider also conducted PHQ-9 and patient scored a 5.  He endorses adequate sleep and reduced appetite.  He informed Clinical research associate that he had a lost of 4 pounds.  Today he denies SI/HI/VAH, mania, paranoia.  No medication changes made today.  Patient agreeable to continue medication as prescribed.  Provider ordered UDS as patient is interested in starting naltrexone to help manage alcohol cravings.  No other concerns noted at this time.   Visit Diagnosis:    ICD-10-CM   1. Generalized anxiety disorder  F41.1 sertraline (ZOLOFT) 100 MG tablet    2. Depression, major, in remission (HCC)  F32.5 Urine Drug Panel 7    sertraline (ZOLOFT) 100 MG tablet    3. Attention deficit hyperactivity disorder (ADHD), predominantly inattentive type  F90.0 Urine Drug Panel 7    atomoxetine (STRATTERA) 40 MG capsule    4. Alcohol consumption binge drinking  F10.10        Past Psychiatric History: Bipolar disorder and ADD Past Medical History:  Past Medical History:  Diagnosis Date   Depression    No past surgical history on file.  Family Psychiatric History: Denies  Family History: No family history on file.  Social History:  Social History   Socioeconomic History   Marital status: Single    Spouse name: Not on file   Number of children: Not on file   Years of education: Not on file   Highest  education level: Not on file  Occupational History   Not on file  Tobacco Use   Smoking status: Never   Smokeless tobacco: Never  Substance and Sexual Activity   Alcohol use: Yes    Alcohol/week: 2.0 standard drinks of alcohol    Types: 2 Cans of beer per week    Comment: 2 DAILY  AND 7-8 ON WEEKEND   Drug use: Yes    Frequency: 7.0 times per week    Types: Marijuana   Sexual activity: Not on file  Other Topics Concern   Not on file  Social History Narrative   Not on file   Social Determinants of Health   Financial Resource  Strain: Not on file  Food Insecurity: Not on file  Transportation Needs: Not on file  Physical Activity: Not on file  Stress: Not on file  Social Connections: Not on file    Allergies: No Known Allergies  Metabolic Disorder Labs: No results found for: "HGBA1C", "MPG" No results found for: "PROLACTIN" Lab Results  Component Value Date   CHOL 142 09/21/2007   TRIG 121 09/21/2007   HDL 60 09/21/2007   CHOLHDL 2.4 Ratio 09/21/2007   VLDL 24 09/21/2007   LDLCALC 58 09/21/2007   Lab Results  Component Value Date   TSH 4.270 07/16/2022    Therapeutic Level Labs: No results found for: "LITHIUM" No results found for: "VALPROATE" No results found for: "CBMZ"  Current Medications: Current Outpatient Medications  Medication Sig Dispense Refill   arginine 500 MG tablet Take 500 mg by mouth daily.     atomoxetine (STRATTERA) 40 MG capsule Take 1 capsule (40 mg total) by mouth daily. 30 capsule 3   HYDROcodone-acetaminophen (NORCO) 5-325 MG tablet Take 1 tablet by mouth every 6 (six) hours as needed for moderate pain. 10 tablet 0   sertraline (ZOLOFT) 100 MG tablet Take 1 tablet (100 mg total) by mouth daily. 30 tablet 3   thiamine (VITAMIN B1) 100 MG tablet Take 1 tablet (100 mg total) by mouth daily. 30 tablet 1   No current facility-administered medications for this visit.     Musculoskeletal: Strength & Muscle Tone: within normal limits and telehealth visit Gait & Station: normal, telehealth visit Patient leans: N/A  Psychiatric Specialty Exam: Review of Systems  There were no vitals taken for this visit.There is no height or weight on file to calculate BMI.  General Appearance: Well Groomed  Eye Contact:  Good  Speech:  Clear and Coherent and Normal Rate  Volume:  Normal  Mood:  Euthymic  Affect:  Congruent  Thought Process:  Coherent, Goal Directed and Linear  Orientation:  Full (Time, Place, and Person)  Thought Content: WDL and Logical   Suicidal Thoughts:  No   Homicidal Thoughts:  No  Memory:  Immediate;   Good Recent;   Good Remote;   Good  Judgement:  Good  Insight:  Good  Psychomotor Activity:  Normal  Concentration:  Concentration: Good and Attention Span: Good  Recall:  Good  Fund of Knowledge: Good  Language: Good  Akathisia:  No  Handed:  Right  AIMS (if indicated): Not done  Assets:  Communication Skills Desire for Improvement Financial Resources/Insurance Housing Social Support  ADL's:  Intact  Cognition: WNL  Sleep:  Good   Screenings: AUDIT    Flowsheet Row Office Visit from 10/16/2022 in [facility]  Alcohol Use Disorder Identification Test Final Score (AUDIT) 13      GAD-7  Flowsheet Row Office Visit from 10/16/2022 in [facility] Clinical Support from 10/29/2021 in [facility] Video Visit from 07/30/2021 in [facility] Video Visit from 02/25/2021 in [facility] Clinical Support from 05/01/2020 in [facility]  Total GAD-7 Score 6 2 0 3 0      PHQ2-9    Flowsheet Row Office Visit from 10/16/2022 in [facility] Clinical Support from 10/29/2021 in [facility] Video Visit from 07/30/2021 in [facility] Video Visit from 02/25/2021 in [facility] Clinical Support from 05/01/2020 in [facility]  PHQ-2 Total Score 0 0 0 0 0  PHQ-9 Total Score 5 0 0 0 0      Flowsheet Row Clinical Support from 05/01/2020 in [facility]  C-SSRS RISK CATEGORY No Risk        Assessment and Plan: Patient reports that he has been off his medications for a month.  He reports that he is more irritable and has rapid speech.  Today he is agreeable to restarting Zoloft and Strattera.  He informed Clinical research associate that  he is interested in naltrexone as he is increased his alcohol consumption.  Patient agreeable to doing UDS prior to starting naltrexone.  1. Depression, major, in remission (HCC)  - Urine Drug Panel 7 - sertraline (ZOLOFT) 100 MG tablet; Take 1 tablet (100 mg total) by mouth daily.  Dispense: 30 tablet; Refill: 3  2. Attention deficit hyperactivity disorder (ADHD), predominantly inattentive type  - Urine Drug Panel 7 Restart- atomoxetine (STRATTERA) 40 MG capsule; Take 1 capsule (40 mg total) by mouth daily.  Dispense: 30 capsule; Refill: 3  3. Generalized anxiety disorder  Restart- sertraline (ZOLOFT) 100 MG tablet; Take 1 tablet (100 mg total) by mouth daily.  Dispense: 30 tablet; Refill: 3  4. Alcohol consumption binge drinking   Follow-up in 3 months  Shanna Cisco, NP 10/16/2022, 8:50 AM

## 2022-12-25 ENCOUNTER — Telehealth (INDEPENDENT_AMBULATORY_CARE_PROVIDER_SITE_OTHER): Payer: No Payment, Other | Admitting: Psychiatry

## 2022-12-25 ENCOUNTER — Encounter (HOSPITAL_COMMUNITY): Payer: Self-pay | Admitting: Psychiatry

## 2022-12-25 DIAGNOSIS — F900 Attention-deficit hyperactivity disorder, predominantly inattentive type: Secondary | ICD-10-CM

## 2022-12-25 DIAGNOSIS — F411 Generalized anxiety disorder: Secondary | ICD-10-CM | POA: Diagnosis not present

## 2022-12-25 DIAGNOSIS — F325 Major depressive disorder, single episode, in full remission: Secondary | ICD-10-CM | POA: Diagnosis not present

## 2022-12-25 MED ORDER — SERTRALINE HCL 100 MG PO TABS: 100.0000 mg | ORAL_TABLET | Freq: Every day | ORAL | 3 refills | Status: DC

## 2022-12-25 MED ORDER — ATOMOXETINE HCL 40 MG PO CAPS: 40.0000 mg | ORAL_CAPSULE | Freq: Every day | ORAL | 3 refills | Status: DC

## 2022-12-25 NOTE — Progress Notes (Signed)
BH MD/PA/NP OP Progress Note    Virtual Visit via Video Note  I connected with Ronald Ferrell on 12/25/22 at 10:00 AM EST by a video enabled telemedicine application and verified that I am speaking with the correct person using two identifiers.  Location: Patient: Home Provider: Clinic   I discussed the limitations of evaluation and management by telemedicine and the availability of in person appointments. The patient expressed understanding and agreed to proceed.  I provided 45 minutes of non-face-to-face time during this encounter.    12/25/2022 10:12 AM Ronald Ferrell  MRN:  [id number]  Chief Complaint: "I have been the same"  HPI: 47 year old male  seen today for follow-up psychiatric evaluation. He has a psychiatric history of ADHD, depression, and bipolar 1 disorder.  He is currently managed on Strattera 40 mg daily and Zoloft 100 mg daily in the morning. Patient notes that his medications are effective in managing his psychiatric condition.   Today was well groomed, pleasant, cooperative, and engaged in conversation. Patient observed walking home from [place].  He notes that since his last visit things have been the same.  He finds his medications are effective and notes that his mood is stable.  Today provider conducted a GAD-7 and patient scored a 1, at his last visit he scored a 6.  Provider also conducted a PHQ-9 and patient scored a 0, at his last visit he scored a 5.  He endorsed adequate sleep and appetite.  Today he denies SI/HI/VH, mania, paranoia.    Patient notes that he continues to drink 4 beers nightly.  Provider informed patient about naltrexone at his last visit and encouraged him to get a urine drug screen.  Provider informed patient that this could be trialed pending a UDS.  He endorsed understanding and agreed.  Overall patient notes that he is doing well.  No medication changes made today.  Patient agreeable to continue medication as prescribed.  If UDS is  completed provider agreeable to starting naltrexone to help manage alcohol cravings.  No other concerns noted at this time.   Visit Diagnosis:    ICD-10-CM   1. Depression, major, in remission (HCC)  F32.5 sertraline (ZOLOFT) 100 MG tablet    2. Generalized anxiety disorder  F41.1 sertraline (ZOLOFT) 100 MG tablet    3. Attention deficit hyperactivity disorder (ADHD), predominantly inattentive type  F90.0 atomoxetine (STRATTERA) 40 MG capsule        Past Psychiatric History: Bipolar disorder and ADD Past Medical History:  Past Medical History:  Diagnosis Date   Depression    History reviewed. No pertinent surgical history.  Family Psychiatric History: Denies  Family History: History reviewed. No pertinent family history.  Social History:  Social History   Socioeconomic History   Marital status: Single    Spouse name: Not on file   Number of children: Not on file   Years of education: Not on file   Highest education level: Not on file  Occupational History   Not on file  Tobacco Use   Smoking status: Never   Smokeless tobacco: Never  Substance and Sexual Activity   Alcohol use: Yes    Alcohol/week: 2.0 standard drinks of alcohol    Types: 2 Cans of beer per week    Comment: 2 DAILY  AND 7-8 ON WEEKEND   Drug use: Yes    Frequency: 7.0 times per week    Types: Marijuana   Sexual activity: Not on file  Other Topics Concern  Not on file  Social History Narrative   Not on file   Social Determinants of Health   Financial Resource Strain: Not on file  Food Insecurity: Not on file  Transportation Needs: Not on file  Physical Activity: Not on file  Stress: Not on file  Social Connections: Not on file    Allergies: No Known Allergies  Metabolic Disorder Labs: No results found for: "HGBA1C", "MPG" No results found for: "PROLACTIN" Lab Results  Component Value Date   CHOL 142 09/21/2007   TRIG 121 09/21/2007   HDL 60 09/21/2007   CHOLHDL 2.4 Ratio  09/21/2007   VLDL 24 09/21/2007   LDLCALC 58 09/21/2007   Lab Results  Component Value Date   TSH 4.270 07/16/2022    Therapeutic Level Labs: No results found for: "LITHIUM" No results found for: "VALPROATE" No results found for: "CBMZ"  Current Medications: Current Outpatient Medications  Medication Sig Dispense Refill   arginine 500 MG tablet Take 500 mg by mouth daily.     atomoxetine (STRATTERA) 40 MG capsule Take 1 capsule (40 mg total) by mouth daily. 30 capsule 3   HYDROcodone-acetaminophen (NORCO) 5-325 MG tablet Take 1 tablet by mouth every 6 (six) hours as needed for moderate pain. 10 tablet 0   sertraline (ZOLOFT) 100 MG tablet Take 1 tablet (100 mg total) by mouth daily. 30 tablet 3   thiamine (VITAMIN B1) 100 MG tablet Take 1 tablet (100 mg total) by mouth daily. 30 tablet 1   No current facility-administered medications for this visit.     Musculoskeletal: Strength & Muscle Tone: within normal limits and telehealth visit Gait & Station: normal, telehealth visit Patient leans: N/A  Psychiatric Specialty Exam: Review of Systems  There were no vitals taken for this visit.There is no height or weight on file to calculate BMI.  General Appearance: Well Groomed  Eye Contact:  Good  Speech:  Clear and Coherent and Normal Rate  Volume:  Normal  Mood:  Euthymic  Affect:  Congruent  Thought Process:  Coherent, Goal Directed and Linear  Orientation:  Full (Time, Place, and Person)  Thought Content: WDL and Logical   Suicidal Thoughts:  No  Homicidal Thoughts:  No  Memory:  Immediate;   Good Recent;   Good Remote;   Good  Judgement:  Good  Insight:  Good  Psychomotor Activity:  Normal  Concentration:  Concentration: Good and Attention Span: Good  Recall:  Good  Fund of Knowledge: Good  Language: Good  Akathisia:  No  Handed:  Right  AIMS (if indicated): Not done  Assets:  Communication Skills Desire for Improvement Financial  Resources/Insurance Housing Social Support  ADL's:  Intact  Cognition: WNL  Sleep:  Good   Screenings: AUDIT    Flowsheet Row Office Visit from 10/16/2022 in [facility]  Alcohol Use Disorder Identification Test Final Score (AUDIT) 13      GAD-7    Flowsheet Row Video Visit from 12/25/2022 in [facility] Office Visit from 10/16/2022 in [facility] Clinical Support from 10/29/2021 in [facility] Video Visit from 07/30/2021 in [facility] Video Visit from 02/25/2021 in [facility]  Total GAD-7 Score 1 6 2  0 3      PHQ2-9    Flowsheet Row Video Visit from 12/25/2022 in [facility] Office Visit from 10/16/2022 in [facility] Clinical Support  from 10/29/2021 in [facility] Video Visit from 07/30/2021 in [facility] Video Visit from 02/25/2021 in [facility]  PHQ-2 Total Score 0 0 0 0 0  PHQ-9 Total Score 0 5 0 0 0      Flowsheet Row Clinical Support from 05/01/2020 in [facility]  C-SSRS RISK CATEGORY No Risk        Assessment and Plan:  Overall patient notes that he is doing well.  No medication changes made today.  Patient agreeable to continue medication as prescribed.  If UDS is completed provider agreeable to starting naltrexone to help manage alcohol cravings.  1. Depression, major, in remission (HCC)  Continue- sertraline (ZOLOFT) 100 MG tablet; Take 1 tablet (100 mg total) by mouth daily.  Dispense: 30 tablet; Refill: 3  2. Generalized anxiety disorder  Continue- sertraline (ZOLOFT) 100 MG tablet; Take 1 tablet (100 mg total) by mouth daily.  Dispense: 30 tablet; Refill: 3  3. Attention deficit hyperactivity disorder (ADHD),  predominantly inattentive type  Continue- atomoxetine (STRATTERA) 40 MG capsule; Take 1 capsule (40 mg total) by mouth daily.  Dispense: 30 capsule; Refill: 3   Follow-up in 3 months  Shanna Cisco, NP 12/25/2022, 10:12 AM

## 2023-03-05 ENCOUNTER — Encounter (HOSPITAL_COMMUNITY): Payer: Self-pay | Admitting: Psychiatry

## 2023-03-05 ENCOUNTER — Telehealth (INDEPENDENT_AMBULATORY_CARE_PROVIDER_SITE_OTHER): Payer: No Payment, Other | Admitting: Psychiatry

## 2023-03-05 DIAGNOSIS — F411 Generalized anxiety disorder: Secondary | ICD-10-CM | POA: Diagnosis not present

## 2023-03-05 DIAGNOSIS — F325 Major depressive disorder, single episode, in full remission: Secondary | ICD-10-CM

## 2023-03-05 DIAGNOSIS — F900 Attention-deficit hyperactivity disorder, predominantly inattentive type: Secondary | ICD-10-CM

## 2023-03-05 MED ORDER — SERTRALINE HCL 100 MG PO TABS: 100.0000 mg | ORAL_TABLET | Freq: Every day | ORAL | 3 refills | Status: DC

## 2023-03-05 MED ORDER — HYDROXYZINE HCL 10 MG PO TABS: 10.0000 mg | ORAL_TABLET | Freq: Three times a day (TID) | ORAL | 3 refills | Status: DC | PRN

## 2023-03-05 MED ORDER — ATOMOXETINE HCL 40 MG PO CAPS: 40.0000 mg | ORAL_CAPSULE | Freq: Every day | ORAL | 3 refills | Status: DC

## 2023-03-05 NOTE — Progress Notes (Signed)
BH MD/PA/NP OP Progress Note    Virtual Visit via Video Note  I connected with Ronald Ferrell on 03/05/23 at 10:00 AM EST by a video enabled telemedicine application and verified that I am speaking with the correct person using two identifiers.  Location: Patient: Home Provider: Clinic   I discussed the limitations of evaluation and management by telemedicine and the availability of in person appointments. The patient expressed understanding and agreed to proceed.  I provided 45 minutes of non-face-to-face time during this encounter.    03/05/2023 10:16 AM Ronald Ferrell  MRN:  [id number]  Chief Complaint: "I was fired from my job"  HPI: 48 year old male  seen today for follow-up psychiatric evaluation. He has a psychiatric history of ADHD, depression, and bipolar 1 disorder.  He is currently managed on Strattera 40 mg daily and Zoloft 100 mg daily in the morning. Patient notes that his medications are effective in managing his psychiatric condition.   Today was well groomed, pleasant, cooperative, and engaged in conversation. Patient informed Clinical research associate that he lost his job yesterday due to his attendance. Patient notes that his attendance was lacking because his father had a heart attack and notes that he took off to care for him.  Patient reports that he had some mild depression and increased anxiety related to this.  He also notes that he continues to worry about his father's by his father getting better.  Patient notes that the above exacerbates his anxiety and depression.  He notes that he is not finding Zoloft as effective as he wants did.  Today provider conducted a GAD-7 and patient scored a 11, at his last visit he scored a 1.  Provider also conducted a PHQ-9 and patient scored a 7, at his last visit he scored a 0.  He endorsed adequate sleep and appetite.  Patient notes that he has been exercising and trying to lose weight.  Today he denies SI/HI/VH, mania, paranoia.    Today patient  agreeable to starting hydroxyzine 10 mg 3 times daily to help manage anxiety.  He will continue other medications as prescribed. Potential side effects of medication and risks vs benefits of treatment vs non-treatment were explained and discussed. All questions were answered. No other concerns noted at this time.   Visit Diagnosis:    ICD-10-CM   1. Depression, major, in remission (HCC)  F32.5 sertraline (ZOLOFT) 100 MG tablet    2. Generalized anxiety disorder  F41.1 hydrOXYzine (ATARAX) 10 MG tablet    sertraline (ZOLOFT) 100 MG tablet    3. Attention deficit hyperactivity disorder (ADHD), predominantly inattentive type  F90.0 atomoxetine (STRATTERA) 40 MG capsule         Past Psychiatric History: Bipolar disorder and ADD Past Medical History:  Past Medical History:  Diagnosis Date   Depression    History reviewed. No pertinent surgical history.  Family Psychiatric History: Denies  Family History: History reviewed. No pertinent family history.  Social History:  Social History   Socioeconomic History   Marital status: Single    Spouse name: Not on file   Number of children: Not on file   Years of education: Not on file   Highest education level: Not on file  Occupational History   Not on file  Tobacco Use   Smoking status: Never   Smokeless tobacco: Never  Substance and Sexual Activity   Alcohol use: Yes    Alcohol/week: 2.0 standard drinks of alcohol    Types: 2 Cans of beer per  week    Comment: 2 DAILY  AND 7-8 ON WEEKEND   Drug use: Yes    Frequency: 7.0 times per week    Types: Marijuana   Sexual activity: Not on file  Other Topics Concern   Not on file  Social History Narrative   Not on file   Social Drivers of Health   Financial Resource Strain: Not on file  Food Insecurity: Not on file  Transportation Needs: Not on file  Physical Activity: Not on file  Stress: Not on file  Social Connections: Not on file    Allergies: No Known  Allergies  Metabolic Disorder Labs: No results found for: "HGBA1C", "MPG" No results found for: "PROLACTIN" Lab Results  Component Value Date   CHOL 142 09/21/2007   TRIG 121 09/21/2007   HDL 60 09/21/2007   CHOLHDL 2.4 Ratio 09/21/2007   VLDL 24 09/21/2007   LDLCALC 58 09/21/2007   Lab Results  Component Value Date   TSH 4.270 07/16/2022    Therapeutic Level Labs: No results found for: "LITHIUM" No results found for: "VALPROATE" No results found for: "CBMZ"  Current Medications: Current Outpatient Medications  Medication Sig Dispense Refill   hydrOXYzine (ATARAX) 10 MG tablet Take 1 tablet (10 mg total) by mouth 3 (three) times daily as needed. 90 tablet 3   arginine 500 MG tablet Take 500 mg by mouth daily.     atomoxetine (STRATTERA) 40 MG capsule Take 1 capsule (40 mg total) by mouth daily. 30 capsule 3   HYDROcodone-acetaminophen (NORCO) 5-325 MG tablet Take 1 tablet by mouth every 6 (six) hours as needed for moderate pain. 10 tablet 0   sertraline (ZOLOFT) 100 MG tablet Take 1 tablet (100 mg total) by mouth daily. 30 tablet 3   thiamine (VITAMIN B1) 100 MG tablet Take 1 tablet (100 mg total) by mouth daily. 30 tablet 1   No current facility-administered medications for this visit.     Musculoskeletal: Strength & Muscle Tone: within normal limits and telehealth visit Gait & Station: normal, telehealth visit Patient leans: N/A  Psychiatric Specialty Exam: Review of Systems  There were no vitals taken for this visit.There is no height or weight on file to calculate BMI.  General Appearance: Well Groomed  Eye Contact:  Good  Speech:  Clear and Coherent and Normal Rate  Volume:  Normal  Mood:  Euthymic  Affect:  Congruent  Thought Process:  Coherent, Goal Directed and Linear  Orientation:  Full (Time, Place, and Person)  Thought Content: WDL and Logical   Suicidal Thoughts:  No  Homicidal Thoughts:  No  Memory:  Immediate;   Good Recent;   Good Remote;    Good  Judgement:  Good  Insight:  Good  Psychomotor Activity:  Normal  Concentration:  Concentration: Good and Attention Span: Good  Recall:  Good  Fund of Knowledge: Good  Language: Good  Akathisia:  No  Handed:  Right  AIMS (if indicated): Not done  Assets:  Communication Skills Desire for Improvement Financial Resources/Insurance Housing Social Support  ADL's:  Intact  Cognition: WNL  Sleep:  Good   Screenings: AUDIT    Flowsheet Row [facility] Visit from 10/16/2022 in [facility]  Alcohol Use Disorder Identification Test Final Score (AUDIT) 13      GAD-7    Flowsheet Row Video Visit from 03/05/2023 in [facility] Video Visit from 12/25/2022 in [facility] Office Visit from 10/16/2022 in  [facility] Clinical Support from 10/29/2021 in [facility] Video Visit from 07/30/2021 in [facility]  Total GAD-7 Score 11 1 6 2  0      PHQ2-9    Flowsheet Row Video Visit from 03/05/2023 in [facility] Video Visit from 12/25/2022 in [facility] Office Visit from 10/16/2022 in [facility] Clinical Support from 10/29/2021 in [facility] Video Visit from 07/30/2021 in [facility]  PHQ-2 Total Score 3 0 0 0 0  PHQ-9 Total Score 7 0 5 0 0      Flowsheet Row Clinical Support from 05/01/2020 in [facility]  C-SSRS RISK CATEGORY No Risk        Assessment and Plan: Patient notes that he has been more anxious  and is not finding Zoloft not as effective as he wants did.Today patient agreeable to starting hydroxyzine 10 mg 3 times daily to help manage anxiety.  He will continue other medications as prescribed.  1. Depression, major, in remission  (HCC)  Continue- sertraline (ZOLOFT) 100 MG tablet; Take 1 tablet (100 mg total) by mouth daily.  Dispense: 30 tablet; Refill: 3  2. Generalized anxiety disorder  Start- hydrOXYzine (ATARAX) 10 MG tablet; Take 1 tablet (10 mg total) by mouth 3 (three) times daily as needed.  Dispense: 90 tablet; Refill: 3 Continue- sertraline (ZOLOFT) 100 MG tablet; Take 1 tablet (100 mg total) by mouth daily.  Dispense: 30 tablet; Refill: 3  3. Attention deficit hyperactivity disorder (ADHD), predominantly inattentive type  Continue- atomoxetine (STRATTERA) 40 MG capsule; Take 1 capsule (40 mg total) by mouth daily.  Dispense: 30 capsule; Refill: 3  Follow-up in 3 months  Shanna Cisco, NP 03/05/2023, 10:16 AM

## 2023-05-21 ENCOUNTER — Telehealth (HOSPITAL_COMMUNITY): Payer: No Payment, Other | Admitting: Psychiatry

## 2023-05-21 ENCOUNTER — Encounter (HOSPITAL_COMMUNITY): Payer: Self-pay | Admitting: Psychiatry

## 2023-05-21 DIAGNOSIS — F325 Major depressive disorder, single episode, in full remission: Secondary | ICD-10-CM

## 2023-05-21 DIAGNOSIS — F900 Attention-deficit hyperactivity disorder, predominantly inattentive type: Secondary | ICD-10-CM | POA: Diagnosis not present

## 2023-05-21 DIAGNOSIS — F411 Generalized anxiety disorder: Secondary | ICD-10-CM

## 2023-05-21 MED ORDER — SERTRALINE HCL 100 MG PO TABS: 100.0000 mg | ORAL_TABLET | Freq: Every day | ORAL | 3 refills | Status: DC

## 2023-05-21 MED ORDER — HYDROXYZINE HCL 10 MG PO TABS: 10.0000 mg | ORAL_TABLET | Freq: Three times a day (TID) | ORAL | 3 refills | Status: DC | PRN

## 2023-05-21 MED ORDER — ATOMOXETINE HCL 40 MG PO CAPS: 40.0000 mg | ORAL_CAPSULE | Freq: Every day | ORAL | 3 refills | Status: DC

## 2023-05-21 NOTE — Progress Notes (Signed)
 BH MD/PA/NP OP Progress Note    Virtual Visit via Video Note  I connected with Ronald Ferrell on 05/21/23 at 10:00 AM EDT by a video enabled telemedicine application and verified that I am speaking with the correct person using two identifiers.  Location: Patient: Home Provider: Clinic   I discussed the limitations of evaluation and management by telemedicine and the availability of in person appointments. The patient expressed understanding and agreed to proceed.  I provided 45 minutes of non-face-to-face time during this encounter.    05/21/2023 10:13 AM Ronald Ferrell  MRN:  [id number]  Chief Complaint: "My anxiety is much better"  HPI: 48 year old male  seen today for follow-up psychiatric evaluation. He has a psychiatric history of ADHD, depression, and bipolar 1 disorder.  He is currently managed on Strattera  40 mg daily, hydroxyzine  10 mg three times daily, and Zoloft  100 mg daily in the morning. Patient notes that his medications are effective in managing his psychiatric condition.   Today was well groomed, pleasant, cooperative, and engaged in conversation. Patient informed Clinical research associate that since starting hydroxyzine  his anxiety has improved. He notes that he continues to try to find a job.  He does note that he has been active, going to the gym, and eating healthy.  Since his last visit he notes that he has lost 10 pounds.   Despite the above stressors patient reports that his anxiety and depression are well-managed.  Today provider conducted a GAD-7 and patient scored a 0, at his last visit he scored a 11.  Provider also conducted a PHQ-9 and patient scored a 0, at his last visit he scored a 7.  He endorsed adequate sleep and appetite.  Today he denies SI/HI/VAH, mania, paranoia.    No medication changes made today.  Patient agreeable to taking medication as prescribed.  No other concerns noted at this time.   Visit Diagnosis:    ICD-10-CM   1. Generalized anxiety disorder  F41.1  hydrOXYzine  (ATARAX ) 10 MG tablet    sertraline  (ZOLOFT ) 100 MG tablet    2. 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 Attention deficit hyperactivity disorder (ADHD), predominantly inattentive type  F90.0 atomoxetine  (STRATTERA ) 40 MG capsule          Past Psychiatric History: Bipolar disorder and ADD Past Medical History:  Past Medical History:  Diagnosis Date   Depression    No past surgical history on file.  Family Psychiatric History: Denies  Family History: No family history on file.  Social History:  Social History   Socioeconomic History   Marital status: Single    Spouse name: Not on file   Number of children: Not on file   Years of education: Not on file   Highest education level: Not on file  Occupational History   Not on file  Tobacco Use   Smoking status: Never   Smokeless tobacco: Never  Substance and Sexual Activity   Alcohol use: Yes    Alcohol/week: 2.0 standard drinks of alcohol    Types: 2 Cans of beer per week    Comment: 2 DAILY  AND 7-8 ON WEEKEND   Drug use: Yes    Frequency: 7.0 times per week    Types: Marijuana   Sexual activity: Not on file  Other Topics Concern   Not on file  Social History Narrative   Not on file   Social Drivers of Health   Financial Resource Strain: Not  on file  Food Insecurity: Not on file  Transportation Needs: Not on file  Physical Activity: Not on file  Stress: Not on file  Social Connections: Not on file    Allergies: No Known Allergies  Metabolic Disorder Labs: No results found for: "HGBA1C", "MPG" No results found for: "PROLACTIN" Lab Results  Component Value Date   CHOL 142 09/21/2007   TRIG 121 09/21/2007   HDL 60 09/21/2007   CHOLHDL 2.4 Ratio 09/21/2007   VLDL 24 09/21/2007   LDLCALC 58 09/21/2007   Lab Results  Component Value Date   TSH 4.270 07/16/2022    Therapeutic Level Labs: No results found for: "LITHIUM" No results found for:  "VALPROATE" No results found for: "CBMZ"  Current Medications: Current Outpatient Medications  Medication Sig Dispense Refill   arginine 500 MG tablet Take 500 mg by mouth daily.     atomoxetine  (STRATTERA ) 40 MG capsule Take 1 capsule (40 mg total) by mouth daily. 30 capsule 3   HYDROcodone -acetaminophen  (NORCO) 5-325 MG tablet Take 1 tablet by mouth every 6 (six) hours as needed for moderate pain. 10 tablet 0   hydrOXYzine  (ATARAX ) 10 MG tablet Take 1 tablet (10 mg total) by mouth 3 (three) times daily as needed. 90 tablet 3   sertraline  (ZOLOFT ) 100 MG tablet Take 1 tablet (100 mg total) by mouth daily. 30 tablet 3   thiamine  (VITAMIN B1) 100 MG tablet Take 1 tablet (100 mg total) by mouth daily. 30 tablet 1   No current facility-administered medications for this visit.     Musculoskeletal: Strength & Muscle Tone: within normal limits and telehealth visit Gait & Station: normal, telehealth visit Patient leans: N/A  Psychiatric Specialty Exam: Review of Systems  There were no vitals taken for this visit.There is no height or weight on file to calculate BMI.  General Appearance: Well Groomed  Eye Contact:  Good  Speech:  Clear and Coherent and Normal Rate  Volume:  Normal  Mood:  Euthymic  Affect:  Congruent  Thought Process:  Coherent, Goal Directed and Linear  Orientation:  Full (Time, Place, and Person)  Thought Content: WDL and Logical   Suicidal Thoughts:  No  Homicidal Thoughts:  No  Memory:  Immediate;   Good Recent;   Good Remote;   Good  Judgement:  Good  Insight:  Good  Psychomotor Activity:  Normal  Concentration:  Concentration: Good and Attention Span: Good  Recall:  Good  Fund of Knowledge: Good  Language: Good  Akathisia:  No  Handed:  Right  AIMS (if indicated): Not done  Assets:  Communication Skills Desire for Improvement Financial Resources/Insurance Housing Social Support  ADL's:  Intact  Cognition: WNL  Sleep:  Good   Screenings: AUDIT     Flowsheet Row Office Visit from 10/16/2022 in [facility]  Alcohol Use Disorder Identification Test Final Score (AUDIT) 13      GAD-7    Flowsheet Row Video Visit from 05/21/2023 in [facility] Video Visit from 03/05/2023 in [facility] Video Visit from 12/25/2022 in [facility] Office Visit from 10/16/2022 in [facility] Clinical Support from 10/29/2021 in [facility]  Total GAD-7 Score 0 11 1 6 2       PHQ2-9    Flowsheet Row Video Visit from 05/21/2023 in [facility] Video Visit from 03/05/2023 in [facility] Video Visit  from 12/25/2022 in [facility] Office Visit from 10/16/2022 in [facility] Clinical Support from 10/29/2021 in [facility]  PHQ-2 Total Score 0 3 0 0 0  PHQ-9 Total Score 0 7 0 5 0      Flowsheet Row Clinical Support from 05/01/2020 in [facility]  C-SSRS RISK CATEGORY No Risk        Assessment and Plan: Patient notes that he has been doing well on his current medication regimen.  He noted that his anxiety has decreased since starting hydroxyzine .  No medication changes made today.  Patient agreeable to taking medication as prescribed.  since starting hydroxyzine .  1. Depression, major, in remission (HCC)  Continue- sertraline  (ZOLOFT ) 100 MG tablet; Take 1 tablet (100 mg total) by mouth daily.  Dispense: 30 tablet; Refill: 3  2. Generalized anxiety disorder  Continue- hydrOXYzine  (ATARAX ) 10 MG tablet; Take 1 tablet (10 mg total) by mouth 3 (three) times daily as needed.  Dispense: 90 tablet; Refill: 3 Continue- sertraline  (ZOLOFT ) 100 MG tablet; Take 1 tablet (100 mg total) by mouth daily.  Dispense: 30 tablet; Refill:  3  3. Attention deficit hyperactivity disorder (ADHD), predominantly inattentive type  Continue- atomoxetine  (STRATTERA ) 40 MG capsule; Take 1 capsule (40 mg total) by mouth daily.  Dispense: 30 capsule; Refill: 3  Follow-up in 3 months  Arlyne Bering, NP 05/21/2023, 10:13 AM

## 2023-07-16 ENCOUNTER — Telehealth (HOSPITAL_COMMUNITY): Admitting: Psychiatry

## 2023-11-25 ENCOUNTER — Encounter (HOSPITAL_COMMUNITY): Payer: Self-pay | Admitting: Psychiatry

## 2023-11-25 ENCOUNTER — Telehealth (INDEPENDENT_AMBULATORY_CARE_PROVIDER_SITE_OTHER): Admitting: Psychiatry

## 2023-11-25 DIAGNOSIS — F411 Generalized anxiety disorder: Secondary | ICD-10-CM

## 2023-11-25 DIAGNOSIS — F325 Major depressive disorder, single episode, in full remission: Secondary | ICD-10-CM

## 2023-11-25 DIAGNOSIS — F900 Attention-deficit hyperactivity disorder, predominantly inattentive type: Secondary | ICD-10-CM

## 2023-11-25 MED ORDER — HYDROXYZINE HCL 10 MG PO TABS: 10.0000 mg | ORAL_TABLET | Freq: Three times a day (TID) | ORAL | 3 refills | Status: DC | PRN

## 2023-11-25 MED ORDER — ATOMOXETINE HCL 40 MG PO CAPS: 40.0000 mg | ORAL_CAPSULE | Freq: Every day | ORAL | 3 refills | Status: DC

## 2023-11-25 MED ORDER — SERTRALINE HCL 100 MG PO TABS: 100.0000 mg | ORAL_TABLET | Freq: Every day | ORAL | 3 refills | Status: DC

## 2023-11-25 NOTE — Progress Notes (Signed)
 [facility] MD/PA/NP OP Progress Note    Virtual Visit via Video Note  I connected with Ronald Ferrell on 11/25/23 at  1:30 PM EDT by a video enabled telemedicine application and verified that I am speaking with the correct person using two identifiers.  Location: Patient: Home Provider: Clinic   I discussed the limitations of evaluation and management by telemedicine and the availability of in person appointments. The patient expressed understanding and agreed to proceed.  I provided 45 minutes of non-face-to-face time during this encounter.    11/25/2023 1:41 PM Ronald Ferrell  MRN:  [id number]  Chief Complaint: I just ran out of my medications  HPI: 48 year old male  seen today for follow-up psychiatric evaluation. He has a psychiatric history of ADHD, depression, and bipolar 1 disorder.  He is currently managed on Strattera  40 mg daily, hydroxyzine  10 mg three times daily, and Zoloft  100 mg daily in the morning. Patient notes that his medications are effective in managing his psychiatric condition.   Today was well groomed, pleasant, cooperative, and engaged in conversation. Patient informed Clinical research associate that he ran out of his medications yesterday.  He was apologetic for missing his last appointment.  He notes that he got a new telephone number.  Since his last visit he notes that his anxiety, depression, and sleep are well-managed.  Today provider conducted a GAD-7 and patient scored a 4, at his last visit he scored a 0.  Provider also conducted a PHQ-9 and patient scored a 1, at his last visit he scored a 0.  He endorsed adequate sleep and appetite.  Today he denies SI/HI/VAH, mania, paranoia.    Patient informed Clinical research associate that he has been living with his father.  He notes that his mother died over a year ago and his father's health declined after.  He notes that his father had 2 heart attacks.  He does note that currently his father is doing well.  Overall patient notes that he is doing well.  No  medication changes made today.  Patient agreeable to taking medication as prescribed.  No other concerns noted at this time.   Visit Diagnosis:    ICD-10-CM   1. Generalized anxiety disorder  F41.1 hydrOXYzine  (ATARAX ) 10 MG tablet    sertraline  (ZOLOFT ) 100 MG tablet    2. Depression, major, in remission  F32.5 sertraline  (ZOLOFT ) 100 MG tablet    3. Attention deficit hyperactivity disorder (ADHD), predominantly inattentive type  F90.0 atomoxetine  (STRATTERA ) 40 MG capsule           Past Psychiatric History: Bipolar disorder and ADD Past Medical History:  Past Medical History:  Diagnosis Date   Depression    History reviewed. No pertinent surgical history.  Family Psychiatric History: Denies  Family History: History reviewed. No pertinent family history.  Social History:  Social History   Socioeconomic History   Marital status: Single    Spouse name: Not on file   Number of children: Not on file   Years of education: Not on file   Highest education level: Not on file  Occupational History   Not on file  Tobacco Use   Smoking status: Never   Smokeless tobacco: Never  Substance and Sexual Activity   Alcohol use: Yes    Alcohol/week: 2.0 standard drinks of alcohol    Types: 2 Cans of beer per week    Comment: 2 DAILY  AND 7-8 ON WEEKEND   Drug use: Yes    Frequency: 7.0 times per week  Types: Marijuana   Sexual activity: Not on file  Other Topics Concern   Not on file  Social History Narrative   Not on file   Social Drivers of Health   Financial Resource Strain: Not on file  Food Insecurity: Not on file  Transportation Needs: Not on file  Physical Activity: Not on file  Stress: Not on file  Social Connections: Not on file    Allergies: No Known Allergies  Metabolic Disorder Labs: No results found for: HGBA1C, MPG No results found for: PROLACTIN Lab Results  Component Value Date   CHOL 142 09/21/2007   TRIG 121 09/21/2007   HDL 60  09/21/2007   CHOLHDL 2.4 Ratio 09/21/2007   VLDL 24 09/21/2007   LDLCALC 58 09/21/2007   Lab Results  Component Value Date   TSH 4.270 07/16/2022    Therapeutic Level Labs: No results found for: LITHIUM No results found for: VALPROATE No results found for: CBMZ  Current Medications: Current Outpatient Medications  Medication Sig Dispense Refill   arginine 500 MG tablet Take 500 mg by mouth daily.     atomoxetine  (STRATTERA ) 40 MG capsule Take 1 capsule (40 mg total) by mouth daily. 30 capsule 3   HYDROcodone -acetaminophen  (NORCO) 5-325 MG tablet Take 1 tablet by mouth every 6 (six) hours as needed for moderate pain. 10 tablet 0   hydrOXYzine  (ATARAX ) 10 MG tablet Take 1 tablet (10 mg total) by mouth 3 (three) times daily as needed. 90 tablet 3   sertraline  (ZOLOFT ) 100 MG tablet Take 1 tablet (100 mg total) by mouth daily. 30 tablet 3   thiamine  (VITAMIN B1) 100 MG tablet Take 1 tablet (100 mg total) by mouth daily. 30 tablet 1   No current facility-administered medications for this visit.     Musculoskeletal: Strength & Muscle Tone: within normal limits and telehealth visit Gait & Station: normal, telehealth visit Patient leans: N/A  Psychiatric Specialty Exam: Review of Systems  There were no vitals taken for this visit.There is no height or weight on file to calculate BMI.  General Appearance: Well Groomed  Eye Contact:  Good  Speech:  Clear and Coherent and Normal Rate  Volume:  Normal  Mood:  Euthymic  Affect:  Congruent  Thought Process:  Coherent, Goal Directed and Linear  Orientation:  Full (Time, Place, and Person)  Thought Content: WDL and Logical   Suicidal Thoughts:  No  Homicidal Thoughts:  No  Memory:  Immediate;   Good Recent;   Good Remote;   Good  Judgement:  Good  Insight:  Good  Psychomotor Activity:  Normal  Concentration:  Concentration: Good and Attention Span: Good  Recall:  Good  Fund of Knowledge: Good  Language: Good   Akathisia:  No  Handed:  Right  AIMS (if indicated): Not done  Assets:  Communication Skills Desire for Improvement Financial Resources/Insurance Housing Social Support  ADL's:  Intact  Cognition: WNL  Sleep:  Good   Screenings: AUDIT    Flowsheet Row Office Visit from 10/16/2022 in [facility]  Alcohol Use Disorder Identification Test Final Score (AUDIT) 13   GAD-7    Flowsheet Row Video Visit from 11/25/2023 in [facility] Video Visit from 05/21/2023 in [facility] Video Visit from 03/05/2023 in [facility] Video Visit from 12/25/2022 in [facility] Office Visit from 10/16/2022 in [facility]  Total GAD-7 Score 4 0  11 1 6    PHQ2-9    Flowsheet Row Video Visit from 11/25/2023 in [facility] Video Visit from 05/21/2023 in [facility] Video Visit from 03/05/2023 in [facility] Video Visit from 12/25/2022 in [facility] Office Visit from 10/16/2022 in [facility]  PHQ-2 Total Score 0 0 3 0 0  PHQ-9 Total Score 1 0 7 0 5   Flowsheet Row Clinical Support from 05/01/2020 in [facility]  C-SSRS RISK CATEGORY No Risk     Assessment and Plan: Patient notes that he has been doing well on his current medication regimen.  No medication changes made today.  Patient agreeable to taking medication as prescribed.    1. Depression, major, in remission (HCC)  Continue- sertraline  (ZOLOFT ) 100 MG tablet; Take 1 tablet (100 mg total) by mouth daily.  Dispense: 30 tablet; Refill: 3  2. Generalized anxiety disorder  Continue- hydrOXYzine  (ATARAX ) 10 MG tablet; Take 1 tablet (10 mg total) by mouth 3 (three) times daily as needed.  Dispense: 90 tablet; Refill:  3 Continue- sertraline  (ZOLOFT ) 100 MG tablet; Take 1 tablet (100 mg total) by mouth daily.  Dispense: 30 tablet; Refill: 3  3. Attention deficit hyperactivity disorder (ADHD), predominantly inattentive type  Continue- atomoxetine  (STRATTERA ) 40 MG capsule; Take 1 capsule (40 mg total) by mouth daily.  Dispense: 30 capsule; Refill: 3  Follow-up in 3 months  Zane FORBES Bach, NP 11/25/2023, 1:41 PM

## 2024-02-10 ENCOUNTER — Telehealth (INDEPENDENT_AMBULATORY_CARE_PROVIDER_SITE_OTHER): Admitting: Psychiatry

## 2024-02-10 ENCOUNTER — Encounter (HOSPITAL_COMMUNITY): Payer: Self-pay | Admitting: Psychiatry

## 2024-02-10 DIAGNOSIS — F325 Major depressive disorder, single episode, in full remission: Secondary | ICD-10-CM

## 2024-02-10 DIAGNOSIS — F900 Attention-deficit hyperactivity disorder, predominantly inattentive type: Secondary | ICD-10-CM

## 2024-02-10 DIAGNOSIS — F411 Generalized anxiety disorder: Secondary | ICD-10-CM

## 2024-02-10 MED ORDER — SERTRALINE HCL 100 MG PO TABS: 100.0000 mg | ORAL_TABLET | Freq: Every day | ORAL | 3 refills | Status: AC

## 2024-02-10 MED ORDER — ATOMOXETINE HCL 40 MG PO CAPS: 40.0000 mg | ORAL_CAPSULE | Freq: Every day | ORAL | 3 refills | Status: AC

## 2024-02-10 MED ORDER — HYDROXYZINE HCL 10 MG PO TABS: 10.0000 mg | ORAL_TABLET | Freq: Three times a day (TID) | ORAL | 3 refills | Status: AC | PRN

## 2024-02-10 NOTE — Progress Notes (Signed)
 [facility] MD/PA/NP OP Progress Note    Virtual Visit via Video Note  I connected with Ronald Ferrell on 02/10/2024 at 11:00 AM EST by a video enabled telemedicine application and verified that I am speaking with the correct person using two identifiers.  Location: Patient: Home Provider: Clinic   I discussed the limitations of evaluation and management by telemedicine and the availability of in person appointments. The patient expressed understanding and agreed to proceed.  I provided 45 minutes of non-face-to-face time during this encounter.    02/10/2024 11:25 AM Ronald Ferrell  MRN:  [id number]  Chief Complaint: Everything is good  HPI: 49 year old male  seen today for follow-up psychiatric evaluation. He has a psychiatric history of ADHD, depression, and bipolar 1 disorder.  He is currently managed on Strattera  40 mg daily, hydroxyzine  10 mg three times daily, and Zoloft  100 mg daily in the morning. Patient notes that his medications are effective in managing his psychiatric condition.   Today was well groomed, pleasant, cooperative, and engaged in conversation. Patient informed clinical research associate that everything is going okay. He notes that he has been more active and recently left the gym. Patient notes that overall his mood is stable and notes that he has minimal anxiety and depression.  Today provider conducted a GAD-7 and patient scored a 9, at his last visit he scored a 4.  Provider also conducted a PHQ-9 and patient scored a 7, at his last visit he scored a 1.  He endorsed adequate sleep and appetite.  Today he denies SI/HI/VAH, mania, paranoia.     No medication changes made today.  Patient agreeable to taking medication as prescribed.  No other concerns noted at this time.   Visit Diagnosis:    ICD-10-CM   1. Attention deficit hyperactivity disorder (ADHD), predominantly inattentive type  F90.0 atomoxetine  (STRATTERA ) 40 MG capsule    2. Generalized anxiety disorder  F41.1 sertraline  (ZOLOFT )  100 MG tablet    hydrOXYzine  (ATARAX ) 10 MG tablet    3. Depression, major, in remission  F32.5 sertraline  (ZOLOFT ) 100 MG tablet            Past Psychiatric History: Bipolar disorder and ADD Past Medical History:  Past Medical History:  Diagnosis Date   Depression    No past surgical history on file.  Family Psychiatric History: Denies  Family History: No family history on file.  Social History:  Social History   Socioeconomic History   Marital status: Single    Spouse name: Not on file   Number of children: Not on file   Years of education: Not on file   Highest education level: Not on file  Occupational History   Not on file  Tobacco Use   Smoking status: Never   Smokeless tobacco: Never  Substance and Sexual Activity   Alcohol use: Yes    Alcohol/week: 2.0 standard drinks of alcohol    Types: 2 Cans of beer per week    Comment: 2 DAILY  AND 7-8 ON WEEKEND   Drug use: Yes    Frequency: 7.0 times per week    Types: Marijuana   Sexual activity: Not on file  Other Topics Concern   Not on file  Social History Narrative   Not on file   Social Drivers of Health   Tobacco Use: Low Risk (11/25/2023)   Patient History    Smoking Tobacco Use: Never    Smokeless Tobacco Use: Never    Passive Exposure: Not on file  Financial Resource Strain: Not on file  Food Insecurity: Not on file  Transportation Needs: Not on file  Physical Activity: Not on file  Stress: Not on file  Social Connections: Not on file  Depression (PHQ2-9): Medium Risk (02/10/2024)   Depression (PHQ2-9)    PHQ-2 Score: 7  Alcohol Screen: Medium Risk (10/16/2022)   Alcohol Screen    Last Alcohol Screening Score (AUDIT): 13  Housing: Not on file  Utilities: Not on file  Health Literacy: Not on file    Allergies: No Known Allergies  Metabolic Disorder Labs: No results found for: HGBA1C, MPG No results found for: PROLACTIN Lab Results  Component Value Date   CHOL 142 09/21/2007    TRIG 121 09/21/2007   HDL 60 09/21/2007   CHOLHDL 2.4 Ratio 09/21/2007   VLDL 24 09/21/2007   LDLCALC 58 09/21/2007   Lab Results  Component Value Date   TSH 4.270 07/16/2022    Therapeutic Level Labs: No results found for: LITHIUM No results found for: VALPROATE No results found for: CBMZ  Current Medications: Current Outpatient Medications  Medication Sig Dispense Refill   arginine 500 MG tablet Take 500 mg by mouth daily.     atomoxetine  (STRATTERA ) 40 MG capsule Take 1 capsule (40 mg total) by mouth daily. 30 capsule 3   HYDROcodone -acetaminophen  (NORCO) 5-325 MG tablet Take 1 tablet by mouth every 6 (six) hours as needed for moderate pain. 10 tablet 0   hydrOXYzine  (ATARAX ) 10 MG tablet Take 1 tablet (10 mg total) by mouth 3 (three) times daily as needed. 90 tablet 3   sertraline  (ZOLOFT ) 100 MG tablet Take 1 tablet (100 mg total) by mouth daily. 30 tablet 3   thiamine  (VITAMIN B1) 100 MG tablet Take 1 tablet (100 mg total) by mouth daily. 30 tablet 1   No current facility-administered medications for this visit.     Musculoskeletal: Strength & Muscle Tone: within normal limits and telehealth visit Gait & Station: normal, telehealth visit Patient leans: N/A  Psychiatric Specialty Exam: Review of Systems  There were no vitals taken for this visit.There is no height or weight on file to calculate BMI.  General Appearance: Well Groomed  Eye Contact:  Good  Speech:  Clear and Coherent and Normal Rate  Volume:  Normal  Mood:  Euthymic  Affect:  Congruent  Thought Process:  Coherent, Goal Directed and Linear  Orientation:  Full (Time, Place, and Person)  Thought Content: WDL and Logical   Suicidal Thoughts:  No  Homicidal Thoughts:  No  Memory:  Immediate;   Good Recent;   Good Remote;   Good  Judgement:  Good  Insight:  Good  Psychomotor Activity:  Normal  Concentration:  Concentration: Good and Attention Span: Good  Recall:  Good  Fund of Knowledge:  Good  Language: Good  Akathisia:  No  Handed:  Right  AIMS (if indicated): Not done  Assets:  Communication Skills Desire for Improvement Financial Resources/Insurance Housing Social Support  ADL's:  Intact  Cognition: WNL  Sleep:  Good   Screenings: AUDIT    Flowsheet Row Office Visit from 10/16/2022 in [facility]  Alcohol Use Disorder Identification Test Final Score (AUDIT) 13   GAD-7    Flowsheet Row Video Visit from 02/10/2024 in [facility] Video Visit from 11/25/2023 in [facility] Video Visit from 05/21/2023 in [facility] Video Visit from 03/05/2023 in [facility]  Video Visit from 12/25/2022 in [facility]  Total GAD-7 Score 9 4 0 11 1   PHQ2-9    Flowsheet Row Video Visit from 02/10/2024 in [facility] Video Visit from 11/25/2023 in [facility] Video Visit from 05/21/2023 in [facility] Video Visit from 03/05/2023 in [facility] Video Visit from 12/25/2022 in [facility]  PHQ-2 Total Score 2 0 0 3 0  PHQ-9 Total Score 7 1 0 7 0   Flowsheet Row Clinical Support from 05/01/2020 in [facility]  C-SSRS RISK CATEGORY No Risk     Assessment and Plan: Patient notes that he has been doing well on his current medication regimen.  No medication changes made today.  Patient agreeable to taking medication as prescribed.    1. Depression, major, in remission (HCC)  Continue- sertraline  (ZOLOFT ) 100 MG tablet; Take 1 tablet (100 mg total) by mouth daily.  Dispense: 30 tablet; Refill: 3  2. Generalized anxiety disorder  Continue- hydrOXYzine  (ATARAX ) 10 MG tablet; Take 1 tablet (10 mg total) by mouth 3 (three) times daily as needed.  Dispense: 90  tablet; Refill: 3 Continue- sertraline  (ZOLOFT ) 100 MG tablet; Take 1 tablet (100 mg total) by mouth daily.  Dispense: 30 tablet; Refill: 3  3. Attention deficit hyperactivity disorder (ADHD), predominantly inattentive type  Continue- atomoxetine  (STRATTERA ) 40 MG capsule; Take 1 capsule (40 mg total) by mouth daily.  Dispense: 30 capsule; Refill: 3  Follow-up in 3 months  Ronald FORBES Bach, NP 02/10/2024, 11:25 AM

## 2024-04-20 ENCOUNTER — Telehealth (HOSPITAL_COMMUNITY): Admitting: Psychiatry
